# Patient Record
Sex: Female | Born: 1995 | Race: White | Hispanic: Yes | Marital: Single | Smoking: Never smoker
Health system: Southern US, Community
[De-identification: ages and names within clinical notes are randomized; demographics above are authoritative.]

## PROBLEM LIST (undated history)

## (undated) VITALS — BP 78/45 | HR 99 | Temp 97.8°F | Resp 16 | Ht 64.0 in | Wt 108.9 lb

## (undated) DIAGNOSIS — F909 Attention-deficit hyperactivity disorder, unspecified type: Secondary | ICD-10-CM

## (undated) DIAGNOSIS — F329 Major depressive disorder, single episode, unspecified: Secondary | ICD-10-CM

## (undated) DIAGNOSIS — J4520 Mild intermittent asthma, uncomplicated: Secondary | ICD-10-CM

## (undated) DIAGNOSIS — R1310 Dysphagia, unspecified: Secondary | ICD-10-CM

## (undated) DIAGNOSIS — F410 Panic disorder [episodic paroxysmal anxiety] without agoraphobia: Secondary | ICD-10-CM

## (undated) DIAGNOSIS — F419 Anxiety disorder, unspecified: Secondary | ICD-10-CM

## (undated) DIAGNOSIS — J309 Allergic rhinitis, unspecified: Secondary | ICD-10-CM

## (undated) DIAGNOSIS — K2930 Chronic superficial gastritis without bleeding: Secondary | ICD-10-CM

## (undated) DIAGNOSIS — F99 Mental disorder, not otherwise specified: Secondary | ICD-10-CM

## (undated) DIAGNOSIS — F32A Depression, unspecified: Secondary | ICD-10-CM

## (undated) DIAGNOSIS — E559 Vitamin D deficiency, unspecified: Secondary | ICD-10-CM

## (undated) DIAGNOSIS — S82899A Other fracture of unspecified lower leg, initial encounter for closed fracture: Secondary | ICD-10-CM

## (undated) HISTORY — DX: Attention-deficit hyperactivity disorder, unspecified type: F90.9

## (undated) HISTORY — PX: TONSILLECTOMY: SUR1361

## (undated) HISTORY — PX: WISDOM TOOTH EXTRACTION: SHX21

## (undated) HISTORY — DX: Mild intermittent asthma, uncomplicated: J45.20

## (undated) HISTORY — DX: Vitamin D deficiency, unspecified: E55.9

## (undated) HISTORY — DX: Other fracture of unspecified lower leg, initial encounter for closed fracture: S82.899A

## (undated) HISTORY — DX: Panic disorder (episodic paroxysmal anxiety): F41.0

## (undated) HISTORY — DX: Dysphagia, unspecified: R13.10

## (undated) HISTORY — DX: Major depressive disorder, single episode, unspecified: F32.9

## (undated) HISTORY — DX: Allergic rhinitis, unspecified: J30.9

## (undated) HISTORY — DX: Chronic superficial gastritis without bleeding: K29.30

---
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
## 2017-01-02 DIAGNOSIS — F419 Anxiety disorder, unspecified: Secondary | ICD-10-CM | POA: Diagnosis not present

## 2017-01-02 DIAGNOSIS — Z713 Dietary counseling and surveillance: Secondary | ICD-10-CM | POA: Diagnosis not present

## 2017-01-02 DIAGNOSIS — Z7182 Exercise counseling: Secondary | ICD-10-CM | POA: Diagnosis not present

## 2017-01-02 DIAGNOSIS — Z0000 Encounter for general adult medical examination without abnormal findings: Secondary | ICD-10-CM | POA: Diagnosis not present

## 2017-01-07 DIAGNOSIS — F4325 Adjustment disorder with mixed disturbance of emotions and conduct: Secondary | ICD-10-CM | POA: Diagnosis not present

## 2017-01-19 ENCOUNTER — Other Ambulatory Visit (HOSPITAL_COMMUNITY): Payer: Self-pay | Admitting: Psychiatry

## 2017-01-19 ENCOUNTER — Encounter (HOSPITAL_COMMUNITY): Payer: Self-pay | Admitting: Psychiatry

## 2017-01-19 DIAGNOSIS — F988 Other specified behavioral and emotional disorders with onset usually occurring in childhood and adolescence: Secondary | ICD-10-CM

## 2017-01-19 MED ORDER — DEXMETHYLPHENIDATE HCL ER 20 MG PO CP24: 20.0000 mg | ORAL_CAPSULE | Freq: Every day | ORAL | 0 refills | Status: DC

## 2017-01-21 ENCOUNTER — Telehealth (HOSPITAL_COMMUNITY): Payer: Self-pay | Admitting: Psychiatry

## 2017-01-21 NOTE — Telephone Encounter (Signed)
Durenda HurtWendee, mother picked up prescription on 1/6/108/8/18 lic [id number] dlh

## 2017-01-22 DIAGNOSIS — F4325 Adjustment disorder with mixed disturbance of emotions and conduct: Secondary | ICD-10-CM | POA: Diagnosis not present

## 2017-02-04 DIAGNOSIS — R002 Palpitations: Secondary | ICD-10-CM | POA: Diagnosis not present

## 2017-02-04 DIAGNOSIS — R9431 Abnormal electrocardiogram [ECG] [EKG]: Secondary | ICD-10-CM | POA: Diagnosis not present

## 2017-02-10 ENCOUNTER — Ambulatory Visit (INDEPENDENT_AMBULATORY_CARE_PROVIDER_SITE_OTHER): Payer: BLUE CROSS/BLUE SHIELD | Admitting: Psychiatry

## 2017-02-10 ENCOUNTER — Encounter (HOSPITAL_COMMUNITY): Payer: Self-pay | Admitting: Psychiatry

## 2017-02-10 DIAGNOSIS — F988 Other specified behavioral and emotional disorders with onset usually occurring in childhood and adolescence: Secondary | ICD-10-CM

## 2017-02-10 DIAGNOSIS — F3342 Major depressive disorder, recurrent, in full remission: Secondary | ICD-10-CM

## 2017-02-10 DIAGNOSIS — F819 Developmental disorder of scholastic skills, unspecified: Secondary | ICD-10-CM | POA: Diagnosis not present

## 2017-02-10 MED ORDER — DEXMETHYLPHENIDATE HCL ER 20 MG PO CP24: 20.0000 mg | ORAL_CAPSULE | Freq: Every day | ORAL | 0 refills | Status: DC

## 2017-02-10 MED ORDER — VENLAFAXINE HCL ER 150 MG PO CP24: ORAL_CAPSULE | ORAL | 0 refills | Status: DC

## 2017-02-10 NOTE — Progress Notes (Signed)
BH MD/PA/NP OP Progress Note  02/10/2017 10:48 AM Felicia [name]  MRN:  [id number]  Chief Complaint: med check  HPI: Felicia [name] continues to reflect on her future goals in terms of school and career. She is tolerating the Focalin without any significant issues. She has struggled with chronic periodic palpitations over the past few years, and is currently with Holter monitor, consultation with pediatric cardiology. This is not exacerbated or new in the setting of stimulant use. 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 We'll continue Focalin XR 20 mg daily, and Effexor and follow-up in 3 months.  Visit Diagnosis:    ICD-10-CM   1. MDD (major depressive disorder), recurrent, in full remission (HCC) F33.42 venlafaxine XR (EFFEXOR-XR) 150 MG 24 hr capsule  2. ADD (attention deficit disorder) without hyperactivity F98.8 dexmethylphenidate (FOCALIN XR) 20 MG 24 hr capsule    Past Psychiatric History: See intake H&P for full details. Reviewed, with no updates at this time.   Past Medical History:  Past Medical History:  Diagnosis Date  . Anxiety   . Broken ankle    at over 3 y/o of age  . Depression   . Mental disorder     Past Surgical History:  Procedure Laterality Date  . TONSILLECTOMY  age 22 or 56  . WISDOM TOOTH EXTRACTION  age 76    Family Psychiatric History: See intake H&P for full details. Reviewed, with no updates at this time.   Family History:  Family History  Problem Relation Age of Onset  . Adopted: Yes    Social History:  Social History   Social History  . Marital status: Single    Spouse name: N/A  . Number of children: N/A  . Years of education: N/A   Occupational History  . Student Unemployed    8th Grade at [employer]   Social History Main Topics  . Smoking status: Never Smoker  . Smokeless  tobacco: Never Used  . Alcohol use No  . Drug use: No  . Sexual activity: No   Other Topics Concern  . None   Social History Narrative  . None    Allergies:  Allergies  Allergen Reactions  . Benadryl [Diphenhydramine Hcl]     Metabolic Disorder Labs: Lab Results  Component Value Date   HGBA1C 5.5 03/12/2012   MPG 111 03/12/2012   Lab Results  Component Value Date   PROLACTIN 31.0 03/12/2012   Lab Results  Component Value Date   CHOL 169 03/12/2012   TRIG 81 03/12/2012   HDL 62 03/12/2012   CHOLHDL 2.7 03/12/2012   VLDL 16 03/12/2012   LDLCALC 91 03/12/2012   Lab Results  Component Value Date   TSH 2.542 03/10/2012    Therapeutic Level Labs: No results found for: LITHIUM No results found for: VALPROATE No components found for:  CBMZ  Current Medications: Current Outpatient Prescriptions  Medication Sig Dispense Refill  . dexmethylphenidate (FOCALIN XR) 20 MG 24 hr capsule Take 1 capsule (20 mg total) by mouth daily. 30 capsule 0  . Melatonin 1 MG TABS Take 2 mg by mouth at bedtime.    . naproxen sodium (ALEVE) 220 MG tablet Take 220 mg by mouth daily as needed.    Marland Kitchen OVER THE COUNTER MEDICATION Take 1 tablet by mouth daily. Allergy medication    .  venlafaxine XR (EFFEXOR-XR) 150 MG 24 hr capsule TAKE 1 CAP BY MOUTH DAILY WITH BREAKFAST 90 capsule 0  . [START ON 03/12/2017] dexmethylphenidate (FOCALIN XR) 20 MG 24 hr capsule Take 1 capsule (20 mg total) by mouth daily. 30 capsule 0  . [START ON 04/11/2017] dexmethylphenidate (FOCALIN XR) 20 MG 24 hr capsule Take 1 capsule (20 mg total) by mouth daily. 30 capsule 0   No current facility-administered medications for this visit.      Musculoskeletal: Strength & Muscle Tone: within normal limits Gait & Station: normal Patient leans: N/A  Psychiatric Specialty Exam: ROS  Blood pressure 118/68, pulse 80, height 5\' 3"  (1.6 m), weight 136 lb 6.4 oz (61.9 kg).Body mass index is 24.16 kg/m.  General  Appearance: Casual and Well Groomed  Eye Contact:  Fair  Speech:  Normal Rate  Volume:  Normal  Mood:  Euthymic  Affect:  Appropriate and Congruent  Thought Process:  Goal Directed  Orientation:  Full (Time, Place, and Person)  Thought Content: Logical   Suicidal Thoughts:  No  Homicidal Thoughts:  No  Memory:  Immediate;   Good  Judgement:  Fair  Insight:  Fair  Psychomotor Activity:  Normal  Concentration:  Concentration: Fair  Recall:  Fair  Fund of Knowledge: Good  Language: Fair  Akathisia:  Negative  Handed:  Right  AIMS (if indicated): not done  Assets:  Communication Skills Desire for Improvement Financial Resources/Insurance Housing Transportation  ADL's:  Intact  Cognition: WNL  Sleep:  Good   Screenings: PHQ2-9     Office Visit from 03/10/2013 in [facility]  PHQ-2 Total Score  0       Assessment and Plan: Felicia [name] is a 21 year old female with ADHD, MDD, and learning disorder who presents today for medication management follow-up. She is stable on the current regimen and will follow up in 3 months. No acute safety issues at this time.  1. MDD (major depressive disorder), recurrent, in full remission (HCC)   2. ADD (attention deficit disorder) without hyperactivity    Continue Focalin XR 20 mg daily Continue Effexor XR 150 mg daily Return to clinic in 3 months  Burnard Leigh, MD 02/10/2017, 10:48 AM

## 2017-02-14 DIAGNOSIS — R000 Tachycardia, unspecified: Secondary | ICD-10-CM | POA: Diagnosis not present

## 2017-03-02 DIAGNOSIS — F4325 Adjustment disorder with mixed disturbance of emotions and conduct: Secondary | ICD-10-CM | POA: Diagnosis not present

## 2017-04-12 DIAGNOSIS — J069 Acute upper respiratory infection, unspecified: Secondary | ICD-10-CM | POA: Diagnosis not present

## 2017-04-12 DIAGNOSIS — J029 Acute pharyngitis, unspecified: Secondary | ICD-10-CM | POA: Diagnosis not present

## 2017-04-20 DIAGNOSIS — F4325 Adjustment disorder with mixed disturbance of emotions and conduct: Secondary | ICD-10-CM | POA: Diagnosis not present

## 2017-05-03 DIAGNOSIS — J069 Acute upper respiratory infection, unspecified: Secondary | ICD-10-CM | POA: Diagnosis not present

## 2017-05-13 ENCOUNTER — Ambulatory Visit (HOSPITAL_COMMUNITY): Payer: BLUE CROSS/BLUE SHIELD | Admitting: Psychiatry

## 2017-05-14 ENCOUNTER — Ambulatory Visit (HOSPITAL_COMMUNITY): Payer: BLUE CROSS/BLUE SHIELD | Admitting: Psychiatry

## 2017-05-14 ENCOUNTER — Encounter (HOSPITAL_COMMUNITY): Payer: Self-pay | Admitting: Psychiatry

## 2017-05-14 DIAGNOSIS — Z79899 Other long term (current) drug therapy: Secondary | ICD-10-CM | POA: Diagnosis not present

## 2017-05-14 DIAGNOSIS — F3342 Major depressive disorder, recurrent, in full remission: Secondary | ICD-10-CM

## 2017-05-14 DIAGNOSIS — G479 Sleep disorder, unspecified: Secondary | ICD-10-CM | POA: Diagnosis not present

## 2017-05-14 DIAGNOSIS — F900 Attention-deficit hyperactivity disorder, predominantly inattentive type: Secondary | ICD-10-CM | POA: Diagnosis not present

## 2017-05-14 MED ORDER — DEXMETHYLPHENIDATE HCL ER 20 MG PO CP24: 20.0000 mg | ORAL_CAPSULE | Freq: Every day | ORAL | 0 refills | Status: DC

## 2017-05-14 MED ORDER — VENLAFAXINE HCL ER 150 MG PO CP24: ORAL_CAPSULE | ORAL | 1 refills | Status: DC

## 2017-05-14 MED ORDER — MELATONIN 1 MG PO TABS: ORAL_TABLET | ORAL | Status: AC

## 2017-05-14 NOTE — Progress Notes (Signed)
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  Reports effexor is tolerated well and focalin XR 20 mg seems to be adequate. 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 She reports that melatonin does help, encouraged her to increase to 5 mg - 10 mg nightly with sundown. She is agreeable and we reviewed potential of using rozerem or hetlioz if needed.  No acute safety issues. Continues to date her boyfriend of 4 years.  No major life stressors currently.  Visit Diagnosis:    ICD-10-CM   1. Attention deficit hyperactivity disorder (ADHD), predominantly inattentive type F90.0 dexmethylphenidate (FOCALIN XR) 20 MG 24 hr capsule    dexmethylphenidate (FOCALIN XR) 20 MG 24 hr capsule    dexmethylphenidate (FOCALIN XR) 20 MG 24 hr capsule  2. MDD (major depressive disorder), recurrent, in full remission (HCC) F33.42 venlafaxine XR (EFFEXOR-XR) 150 MG 24 hr capsule    Melatonin 1 MG TABS    Past Psychiatric History: See intake H&P for full details. Reviewed, with no updates at this time.   Past Medical History:  Past Medical History:  Diagnosis Date  . Anxiety   . Broken ankle    at over 3 y/o of age  . Depression   . Mental disorder     Past Surgical History:  Procedure Laterality Date  . TONSILLECTOMY  age 89 or 89  . WISDOM TOOTH EXTRACTION  age 21    Family Psychiatric History: See intake H&P for full details. Reviewed, with no updates at this time.   Family History:  Family History  Adopted: Yes    Social History:  Social History   Socioeconomic History  . Marital status: Single    Spouse name: Not on file  . Number of children: Not on file  . Years of education: Not on file  . Highest education level: Not on file  Social Needs  .  Financial resource strain: Not on file  . Food insecurity - worry: Not on file  . Food insecurity - inability: Not on file  . Transportation needs - medical: Not on file  . Transportation needs - non-medical: Not on file  Occupational History  . Occupation: Dentisttudent    Employer: UNEMPLOYED    Comment: 8th Grade at [employer]  Tobacco Use  . Smoking status: Never Smoker  . Smokeless tobacco: Never Used  Substance and Sexual Activity  . Alcohol use: No  . Drug use: No  . Sexual activity: No  Other Topics Concern  . Not on file  Social History Narrative  . Not on file    Allergies:  Allergies  Allergen Reactions  . Benadryl [Diphenhydramine Hcl]     Metabolic Disorder Labs: Lab Results  Component Value Date   HGBA1C 5.5 03/12/2012   MPG 111 03/12/2012   Lab Results  Component Value Date   PROLACTIN 31.0 03/12/2012   Lab Results  Component Value Date   CHOL 169 03/12/2012   TRIG 81 03/12/2012   HDL 62 03/12/2012   CHOLHDL 2.7 03/12/2012   VLDL 16 03/12/2012   LDLCALC 91 03/12/2012   Lab Results  Component Value Date   TSH 2.542 03/10/2012    Therapeutic Level Labs: No results found for: LITHIUM No results found  for: VALPROATE No components found for:  CBMZ  Current Medications: Current Outpatient Medications  Medication Sig Dispense Refill  . [START ON 07/13/2017] dexmethylphenidate (FOCALIN XR) 20 MG 24 hr capsule Take 1 capsule (20 mg total) by mouth daily. 30 capsule 0  . [START ON 06/13/2017] dexmethylphenidate (FOCALIN XR) 20 MG 24 hr capsule Take 1 capsule (20 mg total) by mouth daily. 30 capsule 0  . dexmethylphenidate (FOCALIN XR) 20 MG 24 hr capsule Take 1 capsule (20 mg total) by mouth daily. 30 capsule 0  . Melatonin 1 MG TABS Take 5-10 mg at SUNDOWN    . naproxen sodium (ALEVE) 220 MG tablet Take 220 mg by mouth daily as needed.    Marland Kitchen. OVER THE COUNTER MEDICATION Take 1 tablet by mouth daily. Allergy medication    . venlafaxine XR  (EFFEXOR-XR) 150 MG 24 hr capsule TAKE 1 CAP BY MOUTH DAILY WITH BREAKFAST 90 capsule 1   No current facility-administered medications for this visit.      Musculoskeletal: Strength & Muscle Tone: within normal limits Gait & Station: normal Patient leans: N/A  Psychiatric Specialty Exam: ROS  There were no vitals taken for this visit.There is no height or weight on file to calculate BMI.  General Appearance: Casual and Fairly Groomed  Eye Contact:  Good  Speech:  Clear and Coherent  Volume:  Normal  Mood:  Euthymic  Affect:  Congruent  Thought Process:  Coherent and Descriptions of Associations: Intact  Orientation:  Full (Time, Place, and Person)  Thought Content: Logical   Suicidal Thoughts:  No  Homicidal Thoughts:  No  Memory:  Immediate;   Good  Judgement:  Good  Insight:  Fair  Psychomotor Activity:  Normal  Concentration:  Attention Span: Good  Recall:  Good  Fund of Knowledge: Good  Language: Good  Akathisia:  Negative  Handed:  Right  AIMS (if indicated): not done  Assets:  Communication Skills Desire for Improvement Financial Resources/Insurance Housing Intimacy Social Support Talents/Skills Transportation Vocational/Educational  ADL's:  Intact  Cognition: WNL  Sleep:  Fair   Screenings: PHQ2-9     Office Visit from 03/10/2013 in [facility]  PHQ-2 Total Score  0       Assessment and Plan:  Felicia [name] reports overall mood and ADHD stability on the regimen below.  Working consistent at preschool, with improving self esteem, and socialization.  No significant safety issues. Has some symptoms concerning for non-24 sleep disorder, partially responsive to melatonin. Will proceed as below and RTC in 3-4 months.  1. Attention deficit hyperactivity disorder (ADHD), predominantly inattentive type   2. MDD (major depressive disorder), recurrent, in full remission (HCC)     Status of current problems:  stable  Labs Ordered: No orders of the defined types were placed in this encounter.   Labs Reviewed: n/a  Collateral Obtained/Records Reviewed: n/a  Plan:  Continue effexor XR 150 mg daily Continue focalin 20 mg XR daily RTC 3-4 months Consider rozerem if needed for sleep Melatonin 5-10 mg at sundown nightly  I spent 20 minutes with the patient in direct face-to-face clinical care.  Greater than 50% of this time was spent in counseling and coordination of care with the patient.    Burnard LeighAlexander Arya Shantrice Rodenberg, MD 05/14/2017, 4:39 PM

## 2017-05-23 DIAGNOSIS — Z23 Encounter for immunization: Secondary | ICD-10-CM | POA: Diagnosis not present

## 2017-06-05 DIAGNOSIS — F4325 Adjustment disorder with mixed disturbance of emotions and conduct: Secondary | ICD-10-CM | POA: Diagnosis not present

## 2017-06-23 DIAGNOSIS — H1013 Acute atopic conjunctivitis, bilateral: Secondary | ICD-10-CM | POA: Diagnosis not present

## 2017-08-02 DIAGNOSIS — M25571 Pain in right ankle and joints of right foot: Secondary | ICD-10-CM | POA: Diagnosis not present

## 2017-08-06 ENCOUNTER — Encounter (HOSPITAL_COMMUNITY): Payer: Self-pay | Admitting: Psychiatry

## 2017-08-06 ENCOUNTER — Ambulatory Visit (HOSPITAL_COMMUNITY): Payer: BLUE CROSS/BLUE SHIELD | Admitting: Psychiatry

## 2017-08-06 DIAGNOSIS — F900 Attention-deficit hyperactivity disorder, predominantly inattentive type: Secondary | ICD-10-CM | POA: Diagnosis not present

## 2017-08-06 DIAGNOSIS — Z79899 Other long term (current) drug therapy: Secondary | ICD-10-CM

## 2017-08-06 DIAGNOSIS — F3342 Major depressive disorder, recurrent, in full remission: Secondary | ICD-10-CM | POA: Diagnosis not present

## 2017-08-06 MED ORDER — DEXMETHYLPHENIDATE HCL ER 20 MG PO CP24: 20.0000 mg | ORAL_CAPSULE | Freq: Every day | ORAL | 0 refills | Status: DC

## 2017-08-06 MED ORDER — VENLAFAXINE HCL ER 150 MG PO CP24: ORAL_CAPSULE | ORAL | 1 refills | Status: DC

## 2017-08-06 NOTE — Patient Instructions (Signed)
Call [facility] for weekly group therapy  Address: [address] [place], [address] Phone: [phone] Email: [email or web address] Business Hours: M-F 8a-8p, S-S by appointment

## 2017-08-06 NOTE — Progress Notes (Signed)
BH MD/PA/NP OP Progress Note  08/06/2017 3:56 PM Felicia [name]  MRN:  [id number]  Chief Complaint: doing well overall HPI: Felicia Slim reports that overall she is doing well.  No acute safety issues.  She is sleeping well and continues on her current medication management regimen.  Spent some time discussing some of her concerns about being sensitive to criticism, and she would like to learn better strategies for managing her frustration.  Individual therapy has been helpful at points, but I discussed the value of group therapy and provided referral to [facility] counseling.  She was apprehensive but agreeable to go in for an initial visit and to learn more.  No significant side effects from medications.  Overall she feels like she is doing better and her confidence is improving because she is working and more independent.  Visit Diagnosis:    ICD-10-CM   1. Attention deficit hyperactivity disorder (ADHD), predominantly inattentive type F90.0 dexmethylphenidate (FOCALIN XR) 20 MG 24 hr capsule    dexmethylphenidate (FOCALIN XR) 20 MG 24 hr capsule    dexmethylphenidate (FOCALIN XR) 20 MG 24 hr capsule  2. MDD (major depressive disorder), recurrent, in full remission (HCC) F33.42 venlafaxine XR (EFFEXOR-XR) 150 MG 24 hr capsule    Past Psychiatric History: See intake H&P for full details. Reviewed, with no updates at this time.   Past Medical History:  Past Medical History:  Diagnosis Date  . Anxiety   . Broken ankle    at over 3 y/o of age  . Depression   . Mental disorder     Past Surgical History:  Procedure Laterality Date  . TONSILLECTOMY  age 6 or 65  . WISDOM TOOTH EXTRACTION  age 72    Family Psychiatric History: See intake H&P for full details. Reviewed, with no updates at this time.   Family History:  Family History  Adopted: Yes    Social History:  Social History   Socioeconomic History  . Marital status: Single    Spouse name: None  . Number of children:  None  . Years of education: None  . Highest education level: None  Social Needs  . Financial resource strain: None  . Food insecurity - worry: None  . Food insecurity - inability: None  . Transportation needs - medical: None  . Transportation needs - non-medical: None  Occupational History  . Occupation: Dentist: UNEMPLOYED    Comment: 8th Grade at [employer]  Tobacco Use  . Smoking status: Never Smoker  . Smokeless tobacco: Never Used  Substance and Sexual Activity  . Alcohol use: No  . Drug use: No  . Sexual activity: No  Other Topics Concern  . None  Social History Narrative  . None    Allergies:  Allergies  Allergen Reactions  . Benadryl [Diphenhydramine Hcl]     Metabolic Disorder Labs: Lab Results  Component Value Date   HGBA1C 5.5 03/12/2012   MPG 111 03/12/2012   Lab Results  Component Value Date   PROLACTIN 31.0 03/12/2012   Lab Results  Component Value Date   CHOL 169 03/12/2012   TRIG 81 03/12/2012   HDL 62 03/12/2012   CHOLHDL 2.7 03/12/2012   VLDL 16 03/12/2012   LDLCALC 91 03/12/2012   Lab Results  Component Value Date   TSH 2.542 03/10/2012    Therapeutic Level Labs: No results found for: LITHIUM No results found for: VALPROATE No components found for:  CBMZ  Current  Medications: Current Outpatient Medications  Medication Sig Dispense Refill  . Melatonin 1 MG TABS Take 5-10 mg at SUNDOWN    . naproxen sodium (ALEVE) 220 MG tablet Take 220 mg by mouth daily as needed.    Marland Kitchen. OVER THE COUNTER MEDICATION Take 1 tablet by mouth daily. Allergy medication    . [START ON 10/05/2017] dexmethylphenidate (FOCALIN XR) 20 MG 24 hr capsule Take 1 capsule (20 mg total) by mouth daily. 30 capsule 0  . [START ON 09/05/2017] dexmethylphenidate (FOCALIN XR) 20 MG 24 hr capsule Take 1 capsule (20 mg total) by mouth daily. 30 capsule 0  . dexmethylphenidate (FOCALIN XR) 20 MG 24 hr capsule Take 1 capsule (20 mg total) by mouth daily.  30 capsule 0  . venlafaxine XR (EFFEXOR-XR) 150 MG 24 hr capsule TAKE 1 CAP BY MOUTH DAILY WITH BREAKFAST 90 capsule 1   No current facility-administered medications for this visit.      Musculoskeletal: Strength & Muscle Tone: within normal limits Gait & Station: normal Patient leans: N/A  Psychiatric Specialty Exam: ROS  Blood pressure 115/77, pulse (!) 105, height 5\' 3"  (1.6 m), weight 130 lb (59 kg).Body mass index is 23.03 kg/m.  General Appearance: Casual and Fairly Groomed  Eye Contact:  Good  Speech:  Clear and Coherent and Normal Rate  Volume:  Normal  Mood:  Euthymic  Affect:  Appropriate and Congruent  Thought Process:  Goal Directed and Descriptions of Associations: Intact  Orientation:  Full (Time, Place, and Person)  Thought Content: Logical   Suicidal Thoughts:  No  Homicidal Thoughts:  No  Memory:  Immediate;   Good  Judgement:  Good  Insight:  Good  Psychomotor Activity:  Normal  Concentration:  Attention Span: Good  Recall:  Good  Fund of Knowledge: Good  Language: Good  Akathisia:  Negative  Handed:  Right  AIMS (if indicated): not done  Assets:  Communication Skills Desire for Improvement Financial Resources/Insurance Housing Intimacy Leisure Time Physical Health Resilience Social Support Talents/Skills Transportation Vocational/Educational  ADL's:  Intact  Cognition: WNL  Sleep:  Good   Screenings: PHQ2-9     Office Visit from 03/10/2013 in [facility]  PHQ-2 Total Score  0       Assessment and Plan:  Felicia [name] presents as stable on the current medication regimen. No acute safety issues or substance abuse concerns at this time.  Will proceed as below and RTC in 3 months.  [facility], I am hopeful that she will take away skills to better advocate and express herself in conflict.  1. Attention deficit hyperactivity disorder (ADHD), predominantly  inattentive type   2. MDD (major depressive disorder), recurrent, in full remission (HCC)     Status of current problems: stable  Labs Ordered: No orders of the defined types were placed in this encounter.   Labs Reviewed: n/a  Collateral Obtained/Records Reviewed: n/a  Plan:  Effexor 150 mg XR daily Focalin 20 mg XR daily rtc 3-4 months  I spent 20 minutes with the patient in direct face-to-face clinical care.  Greater than 50% of this time was spent in counseling and coordination of care with the patient.    Burnard LeighAlexander Arya Eksir, MD 08/06/2017, 3:56 PM

## 2017-08-12 DIAGNOSIS — F4325 Adjustment disorder with mixed disturbance of emotions and conduct: Secondary | ICD-10-CM | POA: Diagnosis not present

## 2017-08-26 DIAGNOSIS — F5104 Psychophysiologic insomnia: Secondary | ICD-10-CM | POA: Diagnosis not present

## 2017-08-26 DIAGNOSIS — F909 Attention-deficit hyperactivity disorder, unspecified type: Secondary | ICD-10-CM | POA: Diagnosis not present

## 2017-08-26 DIAGNOSIS — F329 Major depressive disorder, single episode, unspecified: Secondary | ICD-10-CM | POA: Diagnosis not present

## 2017-09-21 DIAGNOSIS — F4325 Adjustment disorder with mixed disturbance of emotions and conduct: Secondary | ICD-10-CM | POA: Diagnosis not present

## 2017-09-25 DIAGNOSIS — H1032 Unspecified acute conjunctivitis, left eye: Secondary | ICD-10-CM | POA: Diagnosis not present

## 2017-10-07 DIAGNOSIS — F329 Major depressive disorder, single episode, unspecified: Secondary | ICD-10-CM | POA: Diagnosis not present

## 2017-10-07 DIAGNOSIS — D72828 Other elevated white blood cell count: Secondary | ICD-10-CM | POA: Diagnosis not present

## 2017-10-07 DIAGNOSIS — E559 Vitamin D deficiency, unspecified: Secondary | ICD-10-CM | POA: Diagnosis not present

## 2017-10-07 DIAGNOSIS — H1089 Other conjunctivitis: Secondary | ICD-10-CM | POA: Diagnosis not present

## 2017-10-08 DIAGNOSIS — F4325 Adjustment disorder with mixed disturbance of emotions and conduct: Secondary | ICD-10-CM | POA: Diagnosis not present

## 2017-10-21 DIAGNOSIS — F4325 Adjustment disorder with mixed disturbance of emotions and conduct: Secondary | ICD-10-CM | POA: Diagnosis not present

## 2017-11-03 ENCOUNTER — Encounter (HOSPITAL_COMMUNITY): Payer: Self-pay | Admitting: Psychiatry

## 2017-11-03 ENCOUNTER — Ambulatory Visit (HOSPITAL_COMMUNITY): Payer: BLUE CROSS/BLUE SHIELD | Admitting: Psychiatry

## 2017-11-03 DIAGNOSIS — Z560 Unemployment, unspecified: Secondary | ICD-10-CM

## 2017-11-03 DIAGNOSIS — F3342 Major depressive disorder, recurrent, in full remission: Secondary | ICD-10-CM | POA: Diagnosis not present

## 2017-11-03 DIAGNOSIS — F900 Attention-deficit hyperactivity disorder, predominantly inattentive type: Secondary | ICD-10-CM | POA: Diagnosis not present

## 2017-11-03 DIAGNOSIS — G4700 Insomnia, unspecified: Secondary | ICD-10-CM

## 2017-11-03 MED ORDER — VENLAFAXINE HCL ER 150 MG PO CP24: ORAL_CAPSULE | ORAL | 1 refills | Status: AC

## 2017-11-03 MED ORDER — DEXMETHYLPHENIDATE HCL ER 20 MG PO CP24: 20.0000 mg | ORAL_CAPSULE | Freq: Every day | ORAL | 0 refills | Status: DC

## 2017-11-03 MED ORDER — TRAZODONE HCL 50 MG PO TABS: 50.0000 mg | ORAL_TABLET | Freq: Every day | ORAL | 1 refills | Status: DC

## 2017-11-03 NOTE — Progress Notes (Signed)
BH MD/PA/NP OP Progress Note  11/03/2017 4:07 PM Felicia [name]  MRN:  [id number]  Chief Complaint: adhd, med management follow-up  HPI: Felicia [name] has been doing well overall in terms of mood, ADHD.  She is having trouble sleeping, and the melatonin has worn away a little bit.  We agreed to try a small dose of trazodone nightly, and reviewed some of the common side effects.  She reports that she was laid off from her job due to financial constraints of the business, and she is sad about this but also happy that she was able to have many good months there.  She is in the process of working on finding a new job and is going to visit [employer] for some help in this.  Spent time with the patient alone and inquired about sexual activity, substance use, safety issues.  She remains abstinent, denies any and all substance abuse and has no acute safety concerns.    We agreed to continue Effexor and Focalin XR at the current doses, along with start a trial of trazodone.  We will follow-up in 3-4 months or sooner if needed.  Visit Diagnosis:    ICD-10-CM   1. Attention deficit hyperactivity disorder (ADHD), predominantly inattentive type F90.0 dexmethylphenidate (FOCALIN XR) 20 MG 24 hr capsule    dexmethylphenidate (FOCALIN XR) 20 MG 24 hr capsule    dexmethylphenidate (FOCALIN XR) 20 MG 24 hr capsule  2. MDD (major depressive disorder), recurrent, in full remission (HCC) F33.42 venlafaxine XR (EFFEXOR-XR) 150 MG 24 hr capsule    Past Psychiatric History: See intake H&P for full details. Reviewed, with no updates at this time.  Past Medical History:  Past Medical History:  Diagnosis Date  . Anxiety   . Broken ankle    at over 3 y/o of age  . Depression   . Mental disorder     Past Surgical History:  Procedure Laterality Date  . TONSILLECTOMY  age 63 or 61  . WISDOM TOOTH EXTRACTION  age 78    Family Psychiatric History: See intake H&P for full details. Reviewed, with no updates at this  time.   Family History:  Family History  Adopted: Yes    Social History:  Social History   Socioeconomic History  . Marital status: Single    Spouse name: Not on file  . Number of children: Not on file  . Years of education: Not on file  . Highest education level: Not on file  Occupational History  . Occupation: Dentist: UNEMPLOYED    Comment: 8th Grade at [employer]  Social Needs  . Financial resource strain: Not on file  . Food insecurity:    Worry: Not on file    Inability: Not on file  . Transportation needs:    Medical: Not on file    Non-medical: Not on file  Tobacco Use  . Smoking status: Never Smoker  . Smokeless tobacco: Never Used  Substance and Sexual Activity  . Alcohol use: No  . Drug use: No  . Sexual activity: Never  Lifestyle  . Physical activity:    Days per week: Not on file    Minutes per session: Not on file  . Stress: Not on file  Relationships  . Social connections:    Talks on phone: Not on file    Gets together: Not on file    Attends religious service: Not on file    Active member of  club or organization: Not on file    Attends meetings of clubs or organizations: Not on file    Relationship status: Not on file  Other Topics Concern  . Not on file  Social History Narrative  . Not on file    Allergies:  Allergies  Allergen Reactions  . Benadryl [Diphenhydramine Hcl]     Metabolic Disorder Labs: Lab Results  Component Value Date   HGBA1C 5.5 03/12/2012   MPG 111 03/12/2012   Lab Results  Component Value Date   PROLACTIN 31.0 03/12/2012   Lab Results  Component Value Date   CHOL 169 03/12/2012   TRIG 81 03/12/2012   HDL 62 03/12/2012   CHOLHDL 2.7 03/12/2012   VLDL 16 03/12/2012   LDLCALC 91 03/12/2012   Lab Results  Component Value Date   TSH 2.542 03/10/2012    Therapeutic Level Labs: No results found for: LITHIUM No results found for: VALPROATE No components found for:   CBMZ  Current Medications: Current Outpatient Medications  Medication Sig Dispense Refill  . [START ON 12/29/2017] dexmethylphenidate (FOCALIN XR) 20 MG 24 hr capsule Take 1 capsule (20 mg total) by mouth daily. 30 capsule 0  . [START ON 12/01/2017] dexmethylphenidate (FOCALIN XR) 20 MG 24 hr capsule Take 1 capsule (20 mg total) by mouth daily. 30 capsule 0  . dexmethylphenidate (FOCALIN XR) 20 MG 24 hr capsule Take 1 capsule (20 mg total) by mouth daily. 30 capsule 0  . Melatonin 1 MG TABS Take 5-10 mg at SUNDOWN    . naproxen sodium (ALEVE) 220 MG tablet Take 220 mg by mouth daily as needed.    Marland Kitchen OVER THE COUNTER MEDICATION Take 1 tablet by mouth daily. Allergy medication    . venlafaxine XR (EFFEXOR-XR) 150 MG 24 hr capsule TAKE 1 CAP BY MOUTH DAILY WITH BREAKFAST 90 capsule 1  . [START ON 01/26/2018] dexmethylphenidate (FOCALIN XR) 20 MG 24 hr capsule Take 1 capsule (20 mg total) by mouth daily. 30 capsule 0  . traZODone (DESYREL) 50 MG tablet Take 1 tablet (50 mg total) by mouth at bedtime. 90 tablet 1   No current facility-administered medications for this visit.      Musculoskeletal: Strength & Muscle Tone: within normal limits Gait & Station: normal Patient leans: N/A  Psychiatric Specialty Exam: ROS  Blood pressure 112/72, pulse (!) 109, height  (1.6 m), weight 149 lb (67.6 kg), SpO2 97 %.Body mass index is 26.39 kg/m.  General Appearance: Casual and Well Groomed  Eye Contact:  Good  Speech:  Clear and Coherent and Normal Rate  Volume:  Normal  Mood:  Euthymic  Affect:  Appropriate and Congruent  Thought Process:  Goal Directed and Descriptions of Associations: Intact  Orientation:  Full (Time, Place, and Person)  Thought Content: Logical   Suicidal Thoughts:  No  Homicidal Thoughts:  No  Memory:  Immediate;   Fair  Judgement:  Good  Insight:  Good  Psychomotor Activity:  Normal  Concentration:  Concentration: Good  Recall:  Good  Fund of Knowledge: Good   Language: Good  Akathisia:  Negative  Handed:  Right  AIMS (if indicated): not done  Assets:  Communication Skills Desire for Improvement Financial Resources/Insurance Housing  ADL's:  Intact  Cognition: WNL  Sleep:  Good   Screenings: PHQ2-9     Office Visit from 03/10/2013 in [facility]  PHQ-2 Total Score  0      Assessment and Plan: Trang R  Hickok presents with overall stable mood and ADHD symptoms.  Some insomnia symptoms we will address with low-dose of trazodone, and follow-up in 3 months.  She continues to work on establishing a job for herself, and is in the process of finding a new position.  No unsafe thoughts, substance abuse, and she is not sexually active at this time.  1. Attention deficit hyperactivity disorder (ADHD), predominantly inattentive type   2. MDD (major depressive disorder), recurrent, in full remission (HCC)     Status of current problems: stable  Labs Ordered: No orders of the defined types were placed in this encounter.  Labs Reviewed: n/a  Collateral Obtained/Records Reviewed: mom present for part of interaction, nccsd reviewed   Plan:  Continue Effexor and Focalin XR as prescribed rtc 3-4 months   Burnard Leigh, MD 11/03/2017, 4:07 PM

## 2017-11-05 DIAGNOSIS — F4325 Adjustment disorder with mixed disturbance of emotions and conduct: Secondary | ICD-10-CM | POA: Diagnosis not present

## 2017-12-08 DIAGNOSIS — F4325 Adjustment disorder with mixed disturbance of emotions and conduct: Secondary | ICD-10-CM | POA: Diagnosis not present

## 2017-12-28 DIAGNOSIS — F4325 Adjustment disorder with mixed disturbance of emotions and conduct: Secondary | ICD-10-CM | POA: Diagnosis not present

## 2018-02-16 DIAGNOSIS — F332 Major depressive disorder, recurrent severe without psychotic features: Secondary | ICD-10-CM | POA: Diagnosis not present

## 2018-03-31 DIAGNOSIS — F332 Major depressive disorder, recurrent severe without psychotic features: Secondary | ICD-10-CM | POA: Diagnosis not present

## 2018-04-21 DIAGNOSIS — F332 Major depressive disorder, recurrent severe without psychotic features: Secondary | ICD-10-CM | POA: Diagnosis not present

## 2018-06-14 DIAGNOSIS — F332 Major depressive disorder, recurrent severe without psychotic features: Secondary | ICD-10-CM | POA: Diagnosis not present

## 2018-08-25 DIAGNOSIS — Z0000 Encounter for general adult medical examination without abnormal findings: Secondary | ICD-10-CM | POA: Diagnosis not present

## 2018-08-25 DIAGNOSIS — Z13220 Encounter for screening for lipoid disorders: Secondary | ICD-10-CM | POA: Diagnosis not present

## 2018-08-25 DIAGNOSIS — Z23 Encounter for immunization: Secondary | ICD-10-CM | POA: Diagnosis not present

## 2018-08-25 DIAGNOSIS — F329 Major depressive disorder, single episode, unspecified: Secondary | ICD-10-CM | POA: Diagnosis not present

## 2018-08-25 DIAGNOSIS — F909 Attention-deficit hyperactivity disorder, unspecified type: Secondary | ICD-10-CM | POA: Diagnosis not present

## 2018-09-13 DIAGNOSIS — F332 Major depressive disorder, recurrent severe without psychotic features: Secondary | ICD-10-CM | POA: Diagnosis not present

## 2018-12-21 DIAGNOSIS — F332 Major depressive disorder, recurrent severe without psychotic features: Secondary | ICD-10-CM | POA: Diagnosis not present

## 2019-02-10 DIAGNOSIS — F332 Major depressive disorder, recurrent severe without psychotic features: Secondary | ICD-10-CM | POA: Diagnosis not present

## 2019-02-25 DIAGNOSIS — F419 Anxiety disorder, unspecified: Secondary | ICD-10-CM | POA: Diagnosis not present

## 2019-02-25 DIAGNOSIS — E559 Vitamin D deficiency, unspecified: Secondary | ICD-10-CM | POA: Diagnosis not present

## 2019-02-25 DIAGNOSIS — F909 Attention-deficit hyperactivity disorder, unspecified type: Secondary | ICD-10-CM | POA: Diagnosis not present

## 2019-04-14 DIAGNOSIS — F332 Major depressive disorder, recurrent severe without psychotic features: Secondary | ICD-10-CM | POA: Diagnosis not present

## 2019-05-31 DIAGNOSIS — F332 Major depressive disorder, recurrent severe without psychotic features: Secondary | ICD-10-CM | POA: Diagnosis not present

## 2019-06-28 ENCOUNTER — Ambulatory Visit (INDEPENDENT_AMBULATORY_CARE_PROVIDER_SITE_OTHER): Payer: BC Managed Care – PPO | Admitting: Clinical

## 2019-06-28 DIAGNOSIS — F89 Unspecified disorder of psychological development: Secondary | ICD-10-CM | POA: Diagnosis not present

## 2019-08-21 DIAGNOSIS — F332 Major depressive disorder, recurrent severe without psychotic features: Secondary | ICD-10-CM | POA: Diagnosis not present

## 2019-08-26 DIAGNOSIS — Z0000 Encounter for general adult medical examination without abnormal findings: Secondary | ICD-10-CM | POA: Diagnosis not present

## 2019-08-26 DIAGNOSIS — Z13220 Encounter for screening for lipoid disorders: Secondary | ICD-10-CM | POA: Diagnosis not present

## 2019-08-26 DIAGNOSIS — E559 Vitamin D deficiency, unspecified: Secondary | ICD-10-CM | POA: Diagnosis not present

## 2019-09-20 ENCOUNTER — Ambulatory Visit: Payer: BC Managed Care – PPO | Admitting: Clinical

## 2019-09-21 ENCOUNTER — Ambulatory Visit: Payer: BC Managed Care – PPO | Admitting: Clinical

## 2019-09-28 ENCOUNTER — Other Ambulatory Visit (HOSPITAL_COMMUNITY)
Admission: RE | Admit: 2019-09-28 | Discharge: 2019-09-28 | Disposition: A | Discharge status: Home / Self Care | Payer: BC Managed Care – PPO | Source: Ambulatory Visit | Attending: Internal Medicine | Admitting: Internal Medicine

## 2019-09-28 ENCOUNTER — Other Ambulatory Visit: Payer: Self-pay | Admitting: Internal Medicine

## 2019-09-28 DIAGNOSIS — Z124 Encounter for screening for malignant neoplasm of cervix: Secondary | ICD-10-CM | POA: Insufficient documentation

## 2019-09-30 LAB — CYTOLOGY - PAP
Adequacy: ABSENT
Diagnosis: NEGATIVE

## 2019-11-13 DIAGNOSIS — F332 Major depressive disorder, recurrent severe without psychotic features: Secondary | ICD-10-CM | POA: Diagnosis not present

## 2019-11-23 ENCOUNTER — Ambulatory Visit: Payer: BC Managed Care – PPO | Admitting: Clinical

## 2019-11-23 ENCOUNTER — Other Ambulatory Visit: Payer: Self-pay

## 2019-12-13 ENCOUNTER — Ambulatory Visit: Payer: Self-pay | Admitting: Clinical

## 2019-12-20 ENCOUNTER — Ambulatory Visit: Payer: BC Managed Care – PPO | Admitting: Clinical

## 2019-12-23 DIAGNOSIS — H5203 Hypermetropia, bilateral: Secondary | ICD-10-CM | POA: Diagnosis not present

## 2019-12-29 ENCOUNTER — Ambulatory Visit (INDEPENDENT_AMBULATORY_CARE_PROVIDER_SITE_OTHER): Payer: BC Managed Care – PPO | Admitting: Clinical

## 2019-12-29 DIAGNOSIS — F89 Unspecified disorder of psychological development: Secondary | ICD-10-CM

## 2019-12-31 DIAGNOSIS — F332 Major depressive disorder, recurrent severe without psychotic features: Secondary | ICD-10-CM | POA: Diagnosis not present

## 2020-02-07 DIAGNOSIS — F332 Major depressive disorder, recurrent severe without psychotic features: Secondary | ICD-10-CM | POA: Diagnosis not present

## 2020-05-03 ENCOUNTER — Ambulatory Visit (INDEPENDENT_AMBULATORY_CARE_PROVIDER_SITE_OTHER): Payer: BC Managed Care – PPO | Admitting: Clinical

## 2020-05-03 DIAGNOSIS — F89 Unspecified disorder of psychological development: Secondary | ICD-10-CM

## 2020-05-18 ENCOUNTER — Ambulatory Visit (INDEPENDENT_AMBULATORY_CARE_PROVIDER_SITE_OTHER): Payer: BC Managed Care – PPO | Admitting: Clinical

## 2020-05-18 DIAGNOSIS — F89 Unspecified disorder of psychological development: Secondary | ICD-10-CM

## 2020-06-27 ENCOUNTER — Ambulatory Visit (INDEPENDENT_AMBULATORY_CARE_PROVIDER_SITE_OTHER): Payer: BC Managed Care – PPO | Admitting: Clinical

## 2020-06-27 DIAGNOSIS — F331 Major depressive disorder, recurrent, moderate: Secondary | ICD-10-CM

## 2020-06-27 DIAGNOSIS — F71 Moderate intellectual disabilities: Secondary | ICD-10-CM | POA: Diagnosis not present

## 2020-07-05 ENCOUNTER — Ambulatory Visit (INDEPENDENT_AMBULATORY_CARE_PROVIDER_SITE_OTHER): Payer: BC Managed Care – PPO | Admitting: Clinical

## 2020-07-05 DIAGNOSIS — F89 Unspecified disorder of psychological development: Secondary | ICD-10-CM | POA: Diagnosis not present

## 2020-07-16 ENCOUNTER — Ambulatory Visit: Payer: BC Managed Care – PPO | Admitting: Clinical

## 2020-07-18 ENCOUNTER — Ambulatory Visit (INDEPENDENT_AMBULATORY_CARE_PROVIDER_SITE_OTHER): Payer: BC Managed Care – PPO | Admitting: Clinical

## 2020-07-18 DIAGNOSIS — F840 Autistic disorder: Secondary | ICD-10-CM | POA: Diagnosis not present

## 2020-08-01 ENCOUNTER — Ambulatory Visit (INDEPENDENT_AMBULATORY_CARE_PROVIDER_SITE_OTHER): Payer: BC Managed Care – PPO | Admitting: Clinical

## 2020-08-01 DIAGNOSIS — F89 Unspecified disorder of psychological development: Secondary | ICD-10-CM | POA: Diagnosis not present

## 2020-08-14 ENCOUNTER — Ambulatory Visit (INDEPENDENT_AMBULATORY_CARE_PROVIDER_SITE_OTHER): Payer: BC Managed Care – PPO | Admitting: Clinical

## 2020-08-14 DIAGNOSIS — F840 Autistic disorder: Secondary | ICD-10-CM

## 2020-08-14 DIAGNOSIS — F331 Major depressive disorder, recurrent, moderate: Secondary | ICD-10-CM | POA: Diagnosis not present

## 2020-08-28 ENCOUNTER — Ambulatory Visit (INDEPENDENT_AMBULATORY_CARE_PROVIDER_SITE_OTHER): Payer: BC Managed Care – PPO | Admitting: Clinical

## 2020-08-28 DIAGNOSIS — F840 Autistic disorder: Secondary | ICD-10-CM | POA: Diagnosis not present

## 2020-09-10 ENCOUNTER — Ambulatory Visit (INDEPENDENT_AMBULATORY_CARE_PROVIDER_SITE_OTHER): Payer: BC Managed Care – PPO | Admitting: Clinical

## 2020-09-10 DIAGNOSIS — F89 Unspecified disorder of psychological development: Secondary | ICD-10-CM | POA: Diagnosis not present

## 2020-09-11 ENCOUNTER — Ambulatory Visit: Payer: BC Managed Care – PPO | Admitting: Clinical

## 2020-09-24 ENCOUNTER — Ambulatory Visit: Payer: BC Managed Care – PPO | Admitting: Clinical

## 2020-09-25 ENCOUNTER — Ambulatory Visit: Payer: BC Managed Care – PPO | Admitting: Clinical

## 2020-10-09 ENCOUNTER — Ambulatory Visit (INDEPENDENT_AMBULATORY_CARE_PROVIDER_SITE_OTHER): Payer: BC Managed Care – PPO | Admitting: Clinical

## 2020-10-09 DIAGNOSIS — F89 Unspecified disorder of psychological development: Secondary | ICD-10-CM | POA: Diagnosis not present

## 2020-10-10 DIAGNOSIS — Z0000 Encounter for general adult medical examination without abnormal findings: Secondary | ICD-10-CM | POA: Diagnosis not present

## 2020-10-19 DIAGNOSIS — E559 Vitamin D deficiency, unspecified: Secondary | ICD-10-CM | POA: Diagnosis not present

## 2020-10-19 DIAGNOSIS — Z13220 Encounter for screening for lipoid disorders: Secondary | ICD-10-CM | POA: Diagnosis not present

## 2020-10-19 DIAGNOSIS — Z0000 Encounter for general adult medical examination without abnormal findings: Secondary | ICD-10-CM | POA: Diagnosis not present

## 2020-10-23 ENCOUNTER — Ambulatory Visit (INDEPENDENT_AMBULATORY_CARE_PROVIDER_SITE_OTHER): Payer: BC Managed Care – PPO | Admitting: Clinical

## 2020-10-23 DIAGNOSIS — F89 Unspecified disorder of psychological development: Secondary | ICD-10-CM

## 2020-11-06 ENCOUNTER — Ambulatory Visit: Payer: BC Managed Care – PPO | Admitting: Clinical

## 2020-11-19 DIAGNOSIS — R197 Diarrhea, unspecified: Secondary | ICD-10-CM | POA: Diagnosis not present

## 2020-11-19 DIAGNOSIS — R1319 Other dysphagia: Secondary | ICD-10-CM | POA: Diagnosis not present

## 2020-11-20 ENCOUNTER — Ambulatory Visit (INDEPENDENT_AMBULATORY_CARE_PROVIDER_SITE_OTHER): Payer: BC Managed Care – PPO | Admitting: Clinical

## 2020-11-20 DIAGNOSIS — F89 Unspecified disorder of psychological development: Secondary | ICD-10-CM

## 2020-11-21 DIAGNOSIS — R1310 Dysphagia, unspecified: Secondary | ICD-10-CM | POA: Diagnosis not present

## 2020-11-21 DIAGNOSIS — K2930 Chronic superficial gastritis without bleeding: Secondary | ICD-10-CM | POA: Diagnosis not present

## 2020-11-21 DIAGNOSIS — R197 Diarrhea, unspecified: Secondary | ICD-10-CM | POA: Diagnosis not present

## 2020-11-21 DIAGNOSIS — K2289 Other specified disease of esophagus: Secondary | ICD-10-CM | POA: Diagnosis not present

## 2020-12-04 ENCOUNTER — Ambulatory Visit (INDEPENDENT_AMBULATORY_CARE_PROVIDER_SITE_OTHER): Payer: BC Managed Care – PPO | Admitting: Clinical

## 2020-12-04 DIAGNOSIS — F89 Unspecified disorder of psychological development: Secondary | ICD-10-CM | POA: Diagnosis not present

## 2020-12-18 ENCOUNTER — Ambulatory Visit (INDEPENDENT_AMBULATORY_CARE_PROVIDER_SITE_OTHER): Payer: BC Managed Care – PPO | Admitting: Clinical

## 2020-12-18 DIAGNOSIS — F89 Unspecified disorder of psychological development: Secondary | ICD-10-CM | POA: Diagnosis not present

## 2021-01-01 ENCOUNTER — Ambulatory Visit (INDEPENDENT_AMBULATORY_CARE_PROVIDER_SITE_OTHER): Payer: BC Managed Care – PPO | Admitting: Clinical

## 2021-01-01 DIAGNOSIS — F89 Unspecified disorder of psychological development: Secondary | ICD-10-CM | POA: Diagnosis not present

## 2021-01-01 DIAGNOSIS — F331 Major depressive disorder, recurrent, moderate: Secondary | ICD-10-CM

## 2021-01-01 DIAGNOSIS — F71 Moderate intellectual disabilities: Secondary | ICD-10-CM | POA: Diagnosis not present

## 2021-01-15 ENCOUNTER — Ambulatory Visit (INDEPENDENT_AMBULATORY_CARE_PROVIDER_SITE_OTHER): Payer: BC Managed Care – PPO | Admitting: Clinical

## 2021-01-15 DIAGNOSIS — F89 Unspecified disorder of psychological development: Secondary | ICD-10-CM

## 2021-02-13 ENCOUNTER — Ambulatory Visit (INDEPENDENT_AMBULATORY_CARE_PROVIDER_SITE_OTHER): Payer: BC Managed Care – PPO | Admitting: Clinical

## 2021-02-13 DIAGNOSIS — F89 Unspecified disorder of psychological development: Secondary | ICD-10-CM

## 2021-02-16 DIAGNOSIS — F411 Generalized anxiety disorder: Secondary | ICD-10-CM | POA: Diagnosis not present

## 2021-02-16 DIAGNOSIS — F32A Depression, unspecified: Secondary | ICD-10-CM | POA: Diagnosis not present

## 2021-02-27 ENCOUNTER — Ambulatory Visit (INDEPENDENT_AMBULATORY_CARE_PROVIDER_SITE_OTHER): Payer: BC Managed Care – PPO | Admitting: Clinical

## 2021-02-27 DIAGNOSIS — F89 Unspecified disorder of psychological development: Secondary | ICD-10-CM

## 2021-03-13 ENCOUNTER — Ambulatory Visit (INDEPENDENT_AMBULATORY_CARE_PROVIDER_SITE_OTHER): Payer: BC Managed Care – PPO | Admitting: Clinical

## 2021-03-13 DIAGNOSIS — F331 Major depressive disorder, recurrent, moderate: Secondary | ICD-10-CM | POA: Diagnosis not present

## 2021-03-13 DIAGNOSIS — F71 Moderate intellectual disabilities: Secondary | ICD-10-CM

## 2021-03-13 DIAGNOSIS — F840 Autistic disorder: Secondary | ICD-10-CM

## 2021-03-27 ENCOUNTER — Ambulatory Visit (INDEPENDENT_AMBULATORY_CARE_PROVIDER_SITE_OTHER): Payer: BC Managed Care – PPO | Admitting: Clinical

## 2021-03-27 DIAGNOSIS — F840 Autistic disorder: Secondary | ICD-10-CM | POA: Diagnosis not present

## 2021-03-27 DIAGNOSIS — F71 Moderate intellectual disabilities: Secondary | ICD-10-CM | POA: Diagnosis not present

## 2021-03-27 DIAGNOSIS — F331 Major depressive disorder, recurrent, moderate: Secondary | ICD-10-CM | POA: Diagnosis not present

## 2021-04-10 ENCOUNTER — Ambulatory Visit (INDEPENDENT_AMBULATORY_CARE_PROVIDER_SITE_OTHER): Payer: BC Managed Care – PPO | Admitting: Clinical

## 2021-04-10 DIAGNOSIS — F89 Unspecified disorder of psychological development: Secondary | ICD-10-CM

## 2021-04-24 ENCOUNTER — Ambulatory Visit (INDEPENDENT_AMBULATORY_CARE_PROVIDER_SITE_OTHER): Payer: BC Managed Care – PPO | Admitting: Clinical

## 2021-04-24 DIAGNOSIS — F71 Moderate intellectual disabilities: Secondary | ICD-10-CM | POA: Diagnosis not present

## 2021-04-24 DIAGNOSIS — F331 Major depressive disorder, recurrent, moderate: Secondary | ICD-10-CM

## 2021-04-24 DIAGNOSIS — F840 Autistic disorder: Secondary | ICD-10-CM | POA: Diagnosis not present

## 2021-04-26 DIAGNOSIS — J45901 Unspecified asthma with (acute) exacerbation: Secondary | ICD-10-CM | POA: Diagnosis not present

## 2021-04-26 DIAGNOSIS — K2930 Chronic superficial gastritis without bleeding: Secondary | ICD-10-CM | POA: Diagnosis not present

## 2021-04-26 DIAGNOSIS — Z23 Encounter for immunization: Secondary | ICD-10-CM | POA: Diagnosis not present

## 2021-04-27 DIAGNOSIS — F331 Major depressive disorder, recurrent, moderate: Secondary | ICD-10-CM | POA: Diagnosis not present

## 2021-05-22 ENCOUNTER — Ambulatory Visit (INDEPENDENT_AMBULATORY_CARE_PROVIDER_SITE_OTHER): Payer: BC Managed Care – PPO | Admitting: Clinical

## 2021-05-22 DIAGNOSIS — F840 Autistic disorder: Secondary | ICD-10-CM | POA: Diagnosis not present
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
## 2021-05-27 ENCOUNTER — Ambulatory Visit (INDEPENDENT_AMBULATORY_CARE_PROVIDER_SITE_OTHER): Payer: BC Managed Care – PPO | Admitting: Clinical

## 2021-05-27 DIAGNOSIS — F840 Autistic disorder: Secondary | ICD-10-CM | POA: Diagnosis not present

## 2021-05-27 DIAGNOSIS — F331 Major depressive disorder, recurrent, moderate: Secondary | ICD-10-CM | POA: Diagnosis not present

## 2021-05-27 NOTE — Progress Notes (Signed)
Diagnosis: F84.0, F33.2 Time: 11:00am-11:50am CPT: 71062I-94  Felicia [name] was seen remotely using secure video conferencing due to Felicia COVID19 pandemic. She was in her home in [place] and Felicia therapist was in her office at Felicia time of Felicia appointment. She shared that her mood had been stable overall. She continues to see Felicia romantic interest that others in her life have concerns about, but added that she had had productive conversations with her friends and felt they had largely resolved any issues. Therapist talked with her about strategies to ensure her safety, encouraging her toward an open conversation with her mother in order to allow her to support Felicia [name] in being safe. However, Felicia [name] shared no safety concerns. She is scheduled to be seen again in January.  Objectives Related Problem: Felicia [name] experiences symptoms of depression related to difficulty connecting socially with others Description: Felicia [name] will increase her sense of well-being and connectedness and decrease symptoms of depression Target Date: 2021-05-03 Frequency: Biweekly Modality: individual Progress: 0% Planned Intervention: Therapist will provide referrals for additional resources as appropriate  Planned Intervention: Therapist will engage Felicia [name] in behavioral activation, including Felicia incorporation of structure, pleasant events, and mastery activities into her routine  Planned Intervention: Therapist will help Felicia [name] to identify and disengage from maladaptive thought patterns using CBT-based strategies  Planned Intervention: Therapist will provide Felicia [name] with opportunities to process her experiences in session  Planned Intervention: Therapist will provide Felicia [name] with strategies to regulate her emotions, including self-care, mindfulness, meditation, and relaxation exercises  Treatment Plan Client Abilities/Strengths  Felicia [name] presents as motivated to improve her situation.  Client Treatment Preferences  None noted.  Client Statement  of Needs  Felicia [name] is seeking CBT to manage symptoms of depression related to difficulty in social interactions.  Treatment Level  Biweekly  Symptoms  Autism: difficulty perceiving and responding to social cues, engaging in reciprocal conversation (Status: maintained). Depression: sleep irregularity, depressed mood, tearfulness, poor self confidence, feelings of loneliness (Status: maintained).  Problems Addressed  New Description  Goals 1. Sadira experiences symptoms of depression related to difficulty connecting socially with others Objective Felicia [name] will increase her sense of well-being and connectedness and decrease symptoms of depression Target Date: 2021-05-03 Frequency: Biweekly  Progress: 0 Modality: individual  Related Interventions Therapist will provide referrals for additional resources as appropriate Therapist will work with Felicia [name] to develop her social skills incorporating role-play exercises and video modeling, such as through Felicia Felicia [name] role play videos Therapist will engage Felicia [name] in behavioral activation, including Felicia incorporation of structure, pleasant events, and mastery activities into her routine Therapist will provide Felicia [name] with strategies to regulate her emotions, including self-care, mindfulness, meditation, and relaxation exercises Therapist will help Felicia [name] to identify and disengage from maladaptive thought patterns using CBT-based strategies Therapist will provide Felicia [name] with opportunities to process her experiences in session Diagnosis Axis none 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

## 2021-06-05 ENCOUNTER — Ambulatory Visit: Payer: BC Managed Care – PPO | Admitting: Clinical

## 2021-06-19 ENCOUNTER — Ambulatory Visit (INDEPENDENT_AMBULATORY_CARE_PROVIDER_SITE_OTHER): Payer: BC Managed Care – PPO | Admitting: Clinical

## 2021-06-19 DIAGNOSIS — F840 Autistic disorder: Secondary | ICD-10-CM

## 2021-06-19 NOTE — Progress Notes (Signed)
Diagnosis: F84.0, F33.2 Time: 11:00am-11:50am CPT: [id number]-37  Leara was seen remotely using secure video conferencing due to the [employer] pandemic. She was in her home in [place] and the therapist was in her office at the time of the appointment. Her mother joined the beginning of the appointment and shared that concerns with Shauntavia's new interest had escalated among family and friends. Therapist met with Elyanna, who shared that she has found the repeated voicing of concern to prevent her from opening up to friends and family, which has had an isolating effect. Therapist worked with her to consider how to communicate this to her loved ones. Therapist also engaged her in a review and update of her treatment plan. Suicidal ideation and intent were denied. She is scheduled to be seen again in two weeks.  Objectives Related Problem: Briawna experiences symptoms of depression related to difficulty connecting socially with others Description: Beya will increase her sense of well-being and connectedness and decrease symptoms of depression Target Date: 2022-05-03 Frequency: Biweekly Modality: individual Progress: 0% Planned Intervention: Therapist will provide referrals for additional resources as appropriate  Planned Intervention: Therapist will engage Samadhi in behavioral activation, including the incorporation of structure, pleasant events, and mastery activities into her routine  Planned Intervention: Therapist will help Shelita to identify and disengage from maladaptive thought and behavior patterns using CBT-based strategies  Planned Intervention: Therapist will provide Ezell with opportunities to process her experiences in session  Planned Intervention: Therapist will provide Takirah with strategies to regulate her emotions, including self-care, mindfulness, meditation, and relaxation exercises  Treatment Plan Client Abilities/Strengths  Nobie presents as motivated to improve her situation.  Client  Treatment Preferences  None noted.  Client Statement of Needs  Elara is seeking CBT to manage symptoms of depression related to difficulty in social interactions.  Treatment Level  Biweekly  Symptoms  Autism: difficulty perceiving and responding to social cues, engaging in reciprocal conversation (Status: maintained). Depression: sleep irregularity, depressed mood, tearfulness, poor self confidence, feelings of loneliness (Status: maintained).  Problems Addressed  New Description  Goals 1. Lianah experiences symptoms of depression related to difficulty connecting socially with others Objective Azarria will increase her sense of well-being and connectedness and decrease symptoms of depression Target Date: 2021-05-03 Frequency: Biweekly  Progress: 0 Modality: individual  Related Interventions Therapist will provide referrals for additional resources as appropriate Therapist will work with Fallynn to develop her social skills incorporating role-play exercises and video modeling, such as through the [employer] role play videos Therapist will engage Shanyah in behavioral activation, including the incorporation of structure, pleasant events, and mastery activities into her routine Therapist will provide Briawna with strategies to regulate her emotions, including self-care, mindfulness, meditation, and relaxation exercises Therapist will help Janyce to identify and disengage from maladaptive thought patterns using CBT-based strategies Therapist will provide Freedom with opportunities to process her experiences in session Diagnosis Axis none 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

## 2021-06-29 DIAGNOSIS — F331 Major depressive disorder, recurrent, moderate: Secondary | ICD-10-CM | POA: Diagnosis not present

## 2021-07-03 ENCOUNTER — Ambulatory Visit (INDEPENDENT_AMBULATORY_CARE_PROVIDER_SITE_OTHER): Payer: BC Managed Care – PPO | Admitting: Clinical

## 2021-07-03 DIAGNOSIS — F840 Autistic disorder: Secondary | ICD-10-CM

## 2021-07-03 NOTE — Progress Notes (Signed)
Diagnosis: F84.0, F33.2 Time: 11:05am-11:50am CPT: [id number]  Felicia [name] was seen remotely using secure video conferencing due to the COVID19 pandemic. She was in her home in [place] and the therapist was in her office at the time of the appointment. Her mother joined the beginning of the appointment and shared that she had decided to step away from the situation with Felicia [name] and her boyfriend. Felicia [name] then joined the session. She shared that her depressive symptoms have increased of late, mostly in the form of irritability, and upon further discussion speculated that this may be due to two friends moving further away. For homework, she will make plans with at least one friend before her next session. She is scheduled to be seen again in two weeks.  Objectives Related Problem: Felicia [name] experiences symptoms of depression related to difficulty connecting socially with others Description: Felicia [name] will increase her sense of well-being and connectedness and decrease symptoms of depression Target Date: 2022-05-03 Frequency: Biweekly Modality: individual Progress: 0% Planned Intervention: Therapist will provide referrals for additional resources as appropriate  Planned Intervention: Therapist will engage Felicia [name] in behavioral activation, including the incorporation of structure, pleasant events, and mastery activities into her routine  Planned Intervention: Therapist will help Felicia [name] to identify and disengage from maladaptive thought and behavior patterns using CBT-based strategies  Planned Intervention: Therapist will provide Felicia [name] with opportunities to process her experiences in session  Planned Intervention: Therapist will provide Felicia [name] with strategies to regulate her emotions, including self-care, mindfulness, meditation, and relaxation exercises  Treatment Plan Client Abilities/Strengths  Felicia [name] presents as motivated to improve her situation.  Client Treatment Preferences  None noted.  Client Statement of  Needs  Felicia [name] is seeking CBT to manage symptoms of depression related to difficulty in social interactions.  Treatment Level  Biweekly  Symptoms  Autism: difficulty perceiving and responding to social cues, engaging in reciprocal conversation (Status: maintained). Depression: sleep irregularity, depressed mood, tearfulness, poor self confidence, feelings of loneliness (Status: maintained).  Problems Addressed  New Description  Goals 1. Felicia [name] experiences symptoms of depression related to difficulty connecting socially with others Objective Felicia [name] will increase her sense of well-being and connectedness and decrease symptoms of depression Target Date: 2021-05-03 Frequency: Biweekly  Progress: 0 Modality: individual  Related Interventions Therapist will provide referrals for additional resources as appropriate Therapist will work with Felicia [name] to develop her social skills incorporating role-play exercises and video modeling, such as through the [employer] role play videos Therapist will engage Felicia [name] in behavioral activation, including the incorporation of structure, pleasant events, and mastery activities into her routine Therapist will provide Felicia [name] with strategies to regulate her emotions, including self-care, mindfulness, meditation, and relaxation exercises Therapist will help Felicia [name] to identify and disengage from maladaptive thought patterns using CBT-based strategies Therapist will provide Felicia [name] with opportunities to process her experiences in session Diagnosis Axis none 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

## 2021-07-17 ENCOUNTER — Ambulatory Visit (INDEPENDENT_AMBULATORY_CARE_PROVIDER_SITE_OTHER): Payer: BC Managed Care – PPO | Admitting: Clinical

## 2021-07-17 DIAGNOSIS — F331 Major depressive disorder, recurrent, moderate: Secondary | ICD-10-CM | POA: Diagnosis not present

## 2021-07-17 DIAGNOSIS — F840 Autistic disorder: Secondary | ICD-10-CM

## 2021-07-17 NOTE — Progress Notes (Signed)
Diagnosis: F84.0, F33.2 Time: 11:05am-11:55am CPT: [id number]  Felicia [name] was seen remotely using secure video conferencing due to the COVID19 pandemic. She was in her home in [place] and the therapist was in her home at the time of the appointment. She shared that she continues to struggle with an increase in irritability and sadness related to what she believes has been a depressive episode. Therapist worked with her to consider how to continue to engage in relationships with others even when she feels like isolating, as well as other behavioral activation strategies she can incorporate. For homework, she will try to take regular walks outside during which she listens to music she enjoys. She is scheduled to be seen again in two weeks.  Objectives Related Problem: Felicia [name] experiences symptoms of depression related to difficulty connecting socially with others Description: Felicia [name] will increase her sense of well-being and connectedness and decrease symptoms of depression Target Date: 2022-05-03 Frequency: Biweekly Modality: individual Progress: 0% Planned Intervention: Therapist will provide referrals for additional resources as appropriate  Planned Intervention: Therapist will engage Felicia [name] in behavioral activation, including the incorporation of structure, pleasant events, and mastery activities into her routine  Planned Intervention: Therapist will help Felicia [name] to identify and disengage from maladaptive thought and behavior patterns using CBT-based strategies  Planned Intervention: Therapist will provide Felicia [name] with opportunities to process her experiences in session  Planned Intervention: Therapist will provide Felicia [name] with strategies to regulate her emotions, including self-care, mindfulness, meditation, and relaxation exercises  Treatment Plan Client Abilities/Strengths  Felicia [name] presents as motivated to improve her situation.  Client Treatment Preferences  None noted.  Client Statement of Needs   Felicia [name] is seeking CBT to manage symptoms of depression related to difficulty in social interactions.  Treatment Level  Biweekly  Symptoms  Autism: difficulty perceiving and responding to social cues, engaging in reciprocal conversation (Status: maintained). Depression: sleep irregularity, depressed mood, tearfulness, poor self confidence, feelings of loneliness (Status: maintained).  Problems Addressed  New Description  Goals 1. Felicia [name] experiences symptoms of depression related to difficulty connecting socially with others Objective Felicia [name] will increase her sense of well-being and connectedness and decrease symptoms of depression Target Date: 2021-05-03 Frequency: Biweekly  Progress: 0 Modality: individual  Related Interventions Therapist will provide referrals for additional resources as appropriate Therapist will work with [name] to develop her social skills incorporating role-play exercises and video modeling, such as through the [employer] role play videos Therapist will engage [name] in behavioral activation, including the incorporation of structure, pleasant events, and mastery activities into her routine Therapist will provide [name] with strategies to regulate her emotions, including self-care, mindfulness, meditation, and relaxation exercises Therapist will help [name] to identify and disengage from maladaptive thought patterns using CBT-based strategies Therapist will provide [name] with opportunities to process her experiences in session Diagnosis Axis none 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

## 2021-07-26 DIAGNOSIS — J309 Allergic rhinitis, unspecified: Secondary | ICD-10-CM | POA: Diagnosis not present

## 2021-07-26 DIAGNOSIS — J4520 Mild intermittent asthma, uncomplicated: Secondary | ICD-10-CM | POA: Diagnosis not present

## 2021-07-26 DIAGNOSIS — F909 Attention-deficit hyperactivity disorder, unspecified type: Secondary | ICD-10-CM | POA: Diagnosis not present

## 2021-07-31 ENCOUNTER — Ambulatory Visit (INDEPENDENT_AMBULATORY_CARE_PROVIDER_SITE_OTHER): Payer: BC Managed Care – PPO | Admitting: Clinical

## 2021-07-31 DIAGNOSIS — F840 Autistic disorder: Secondary | ICD-10-CM

## 2021-07-31 NOTE — Progress Notes (Signed)
Diagnosis: F84.0, F33.2 Time: 11:03am-11:50am CPT: [id number]  Lakedra was seen remotely using secure video conferencing due to the COVID19 pandemic. She was in her home in [place] and the therapist was in her office at the time of the appointment. 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 Therapist processed several challenging situations in relationships and helped Rushie consider how to communicate her feelings. She is scheduled to be seen again in two weeks.   Objectives Related Problem: Zakyah experiences symptoms of depression related to difficulty connecting socially with others Description: Saramarie will increase her sense of well-being and connectedness and decrease symptoms of depression Target Date: 2022-05-03 Frequency: Biweekly Modality: individual Progress: 0% Planned Intervention: Therapist will provide referrals for additional resources as appropriate  Planned Intervention: Therapist will engage Leeann in behavioral activation, including the incorporation of structure, pleasant events, and mastery activities into her routine  Planned Intervention: Therapist will help Eudelia to identify and disengage from maladaptive thought and behavior patterns using CBT-based strategies  Planned Intervention: Therapist will provide Leenah with opportunities to process her experiences in session  Planned Intervention: Therapist will provide Haniya with strategies to regulate her emotions, including self-care, mindfulness, meditation, and relaxation exercises  Treatment Plan Client Abilities/Strengths  Ellenora presents as motivated to improve her situation.  Client Treatment Preferences  None noted.  Client Statement of Needs  Melissaann is seeking CBT to manage symptoms of depression related to difficulty in social interactions.   Treatment Level  Biweekly  Symptoms  Autism: difficulty perceiving and responding to social cues, engaging in reciprocal conversation (Status: maintained). Depression: sleep irregularity, depressed mood, tearfulness, poor self confidence, feelings of loneliness (Status: maintained).  Problems Addressed  New Description  Goals 1. Ralph experiences symptoms of depression related to difficulty connecting socially with others Objective Leoma will increase her sense of well-being and connectedness and decrease symptoms of depression Target Date: 2021-05-03 Frequency: Biweekly  Progress: 0 Modality: individual  Related Interventions Therapist will provide referrals for additional resources as appropriate Therapist will work with Otha to develop her social skills incorporating role-play exercises and video modeling, such as through the [employer] role play videos Therapist will engage Anniebelle in behavioral activation, including the incorporation of structure, pleasant events, and mastery activities into her routine Therapist will provide Marai with strategies to regulate her emotions, including self-care, mindfulness, meditation, and relaxation exercises Therapist will help Derra to identify and disengage from maladaptive thought patterns using CBT-based strategies Therapist will provide Aissata with opportunities to process her experiences in session Diagnosis Axis none 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

## 2021-08-14 ENCOUNTER — Ambulatory Visit (INDEPENDENT_AMBULATORY_CARE_PROVIDER_SITE_OTHER): Payer: BC Managed Care – PPO | Admitting: Clinical

## 2021-08-14 DIAGNOSIS — F840 Autistic disorder: Secondary | ICD-10-CM | POA: Diagnosis not present

## 2021-08-14 NOTE — Progress Notes (Addendum)
Diagnosis: F84.0, F33.2 ?Time: 11:08am-11:50am ?CPT: [id number] ? ?Felicia [name] was seen remotely using secure video conferencing due to the [place] pandemic. She was in her home in [place] and the therapist was in her home at the time of the appointment. 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 Session focused on recent events in her relationship and friendships, with the therapist offering validation, support, and communication strategies. She is scheduled to be seen again in two weeks. ? ?Objectives ?Related Problem: Felicia [name] experiences symptoms of depression related to difficulty connecting socially with others ?Description: Felicia [name] will increase her sense of well-being and connectedness and decrease symptoms of depression ?Target Date: 2022-05-03 ?Frequency: Biweekly ?Modality: individual ?Progress: 0% ?Planned Intervention: Therapist will provide referrals for additional resources as appropriate  ?Planned Intervention: Therapist will engage Felicia [name] in behavioral activation, including the incorporation of structure, pleasant events, and mastery activities into her routine  ?Planned Intervention: Therapist will help Felicia [name] to identify and disengage from maladaptive thought and behavior patterns using CBT-based strategies  ?Planned Intervention: Therapist will provide Felicia [name] with opportunities to process her experiences in session  ?Planned Intervention: Therapist will provide Felicia [name] with strategies to regulate her emotions, including self-care, mindfulness, meditation, and relaxation exercises  ?Treatment Plan ?Client Abilities/Strengths  ?Felicia [name] presents as motivated to improve her situation.  ?Client Treatment Preferences  ?None noted.  ?Client Statement of Needs  ?Felicia [name] is seeking CBT to manage symptoms of depression related to difficulty in social interactions.  ?Treatment Level   ?Biweekly  ?Symptoms  ?Autism: difficulty perceiving and responding to social cues, engaging in reciprocal conversation (Status: maintained). Depression: sleep irregularity, depressed mood, tearfulness, poor self confidence, feelings of loneliness (Status: maintained).  ?Problems Addressed  ?New Description  ?Goals ?1. Felicia [name] experiences symptoms of depression related to difficulty connecting socially with others ?Objective ?Felicia [name] will increase her sense of well-being and connectedness and decrease symptoms of depression ?Target Date: 2022-05-03 Frequency: Biweekly  ?Progress: 0 Modality: individual  ?Related Interventions ?Therapist will provide referrals for additional resources as appropriate ?Therapist will work with Felicia [name] to develop her social skills incorporating role-play exercises and video modeling, such as through the [employer] role play videos ?Therapist will engage Felicia [name] in behavioral activation, including the incorporation of structure, pleasant events, and mastery activities into her routine ?Therapist will provide Felicia [name] with strategies to regulate her emotions, including self-care, mindfulness, meditation, and relaxation exercises ?Therapist will help Felicia [name] to identify and disengage from maladaptive thought patterns using CBT-based strategies ?Therapist will provide Felicia [name] with opportunities to process her experiences in session ?Diagnosis ?Axis 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 ? ? ?[name], PhD ? ? ? ? ? ? ? ? ? ? ? ? ? ? ?[name], PhD ? ? ? ? ? ? ? ? ? ? ? ? ? ? ?[name], PhD ?

## 2021-08-28 ENCOUNTER — Ambulatory Visit (INDEPENDENT_AMBULATORY_CARE_PROVIDER_SITE_OTHER): Payer: BC Managed Care – PPO | Admitting: Clinical

## 2021-08-28 DIAGNOSIS — F840 Autistic disorder: Secondary | ICD-10-CM

## 2021-08-28 NOTE — Progress Notes (Signed)
Diagnosis: F84.0, F33.2 ?Time: 11:13 am-11:51am ?CPT: [id number] ? ?Felicia [name] was seen remotely using secure video conferencing due to the [place] pandemic. She was in her home in [place] and the therapist was in her office at the time of the appointment. She shared that was especially tired that day and had been sleeping. She reported a challenging two weeks during which a close friend had asked to pause their friendship due to romantic feelings for her, only to contact her again and let her know that he would like to continue the friendship. She also expressed some frustration with the difficulty remaining in contact with her boyfriend. Therapist suggested a conversation about a regularly scheduled time for phone calls, which Felicia [name] expressed interest in, and provided communication strategies. She is scheduled to be seen again in two weeks. ? ? ?Objectives ?Related Problem: Felicia [name] experiences symptoms of depression related to difficulty connecting socially with others ?Description: Felicia [name] will increase her sense of well-being and connectedness and decrease symptoms of depression ?Target Date: 2022-05-03 ?Frequency: Biweekly ?Modality: individual ?Progress: 0% ?Planned Intervention: Therapist will provide referrals for additional resources as appropriate  ?Planned Intervention: Therapist will engage Felicia [name] in behavioral activation, including the incorporation of structure, pleasant events, and mastery activities into her routine  ?Planned Intervention: Therapist will help Felicia [name] to identify and disengage from maladaptive thought and behavior patterns using CBT-based strategies  ?Planned Intervention: Therapist will provide Felicia [name] with opportunities to process her experiences in session  ?Planned Intervention: Therapist will provide Felicia [name] with strategies to regulate her emotions, including self-care, mindfulness, meditation, and relaxation exercises  ?Treatment Plan ?Client Abilities/Strengths  ?Felicia [name] presents as  motivated to improve her situation.  ?Client Treatment Preferences  ?None noted.  ?Client Statement of Needs  ?Felicia [name] is seeking CBT to manage symptoms of depression related to difficulty in social interactions.  ?Treatment Level  ?Biweekly  ?Symptoms  ?Autism: difficulty perceiving and responding to social cues, engaging in reciprocal conversation (Status: maintained). Depression: sleep irregularity, depressed mood, tearfulness, poor self confidence, feelings of loneliness (Status: maintained).  ?Problems Addressed  ?New Description  ?Goals ?1. Felicia [name] experiences symptoms of depression related to difficulty connecting socially with others ?Objective ?Felicia [name] will increase her sense of well-being and connectedness and decrease symptoms of depression ?Target Date: 2022-05-03 Frequency: Biweekly  ?Progress: 0 Modality: individual  ?Related Interventions ?Therapist will provide referrals for additional resources as appropriate ?Therapist will work with Felicia [name] to develop her social skills incorporating role-play exercises and video modeling, such as through the [employer] role play videos ?Therapist will engage Ilea in behavioral activation, including the incorporation of structure, pleasant events, and mastery activities into her routine ?Therapist will provide Almeter with strategies to regulate her emotions, including self-care, mindfulness, meditation, and relaxation exercises ?Therapist will help Felicia [name] to identify and disengage from maladaptive thought patterns using CBT-based strategies ?Therapist will provide Felicia [name] with opportunities to process her experiences in session ?Diagnosis ?Axis 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 ? ? ?[name], PhD ? ? ? ? ? ? ? ? ? ? ? ? ? ? ?[name], PhD ? ? ? ? ? ? ? ? ? ? ? ? ? ? ?[name], PhD ? ? ? ? ? ? ? ? ? ? ? ? ? ? ?[place]  Jennette Banker, PhD ?

## 2021-09-11 ENCOUNTER — Ambulatory Visit (INDEPENDENT_AMBULATORY_CARE_PROVIDER_SITE_OTHER): Payer: BC Managed Care – PPO | Admitting: Clinical

## 2021-09-11 DIAGNOSIS — F840 Autistic disorder: Secondary | ICD-10-CM

## 2021-09-11 NOTE — Progress Notes (Signed)
Diagnosis: F84.0, F33.2 ?Time: 11:01 am-11:51am ?CPT: [id number] ? ?Felicia [name] was seen remotely using secure video conferencing due to the COVID19 pandemic. She was in her home in [place] and the therapist was in her office at the time of the appointment. She shared that she has been doing well overall, and her mood has been slightly happier in recent weeks since starting her new job, which she has been enjoying. She reflected upon recent developments in her relationships, and therapist engaged her in a discussion of communication strategies for the specific situations she described. Therapist checked in as to food cravings and irritability that had been previously reported, and Felicia [name] reported that she continues to experience food cravings and when these arise she often eats until she vomits or feels a need to vomit. Therapist encouraged her to discuss this with her PCP and psychiatrist. She is scheduled to be seen again in two weeks. ? ? ?Objectives ?Related Problem: Felicia [name] experiences symptoms of depression related to difficulty connecting socially with others ?Description: Felicia [name] will increase her sense of well-being and connectedness and decrease symptoms of depression ?Target Date: 2022-05-03 ?Frequency: Biweekly ?Modality: individual ?Progress: 0% ?Planned Intervention: Therapist will provide referrals for additional resources as appropriate  ?Planned Intervention: Therapist will engage Felicia [name] in behavioral activation, including the incorporation of structure, pleasant events, and mastery activities into her routine  ?Planned Intervention: Therapist will help Felicia [name] to identify and disengage from maladaptive thought and behavior patterns using CBT-based strategies  ?Planned Intervention: Therapist will provide Felicia [name] with opportunities to process her experiences in session  ?Planned Intervention: Therapist will provide Felicia [name] with strategies to regulate her emotions, including self-care, mindfulness, meditation,  and relaxation exercises  ?Treatment Plan ?Client Abilities/Strengths  ?Felicia [name] presents as motivated to improve her situation.  ?Client Treatment Preferences  ?None noted.  ?Client Statement of Needs  ?Felicia [name] is seeking CBT to manage symptoms of depression related to difficulty in social interactions.  ?Treatment Level  ?Biweekly  ?Symptoms  ?Autism: difficulty perceiving and responding to social cues, engaging in reciprocal conversation (Status: maintained). Depression: sleep irregularity, depressed mood, tearfulness, poor self confidence, feelings of loneliness (Status: maintained).  ?Problems Addressed  ?New Description  ?Goals ?1. Felicia [name] experiences symptoms of depression related to difficulty connecting socially with others ?Objective ?Felicia [name] will increase her sense of well-being and connectedness and decrease symptoms of depression ?Target Date: 2022-05-03 Frequency: Biweekly  ?Progress: 0 Modality: individual  ?Related Interventions ?Therapist will provide referrals for additional resources as appropriate ?Therapist will work with Felicia [name] to develop her social skills incorporating role-play exercises and video modeling, such as through the [employer] role play videos ?Therapist will engage Felicia [name] in behavioral activation, including the incorporation of structure, pleasant events, and mastery activities into her routine ?Therapist will provide Felicia [name] with strategies to regulate her emotions, including self-care, mindfulness, meditation, and relaxation exercises ?Therapist will help Felicia [name] to identify and disengage from maladaptive thought patterns using CBT-based strategies ?Therapist will provide Felicia [name] with opportunities to process her experiences in session ?Diagnosis ?Axis 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 ? ? ?Felicia Noa, PhD ? ? ? ? ? ? ? ? ? ? ? ? ? ? ?Felicia Noa, PhD ? ? ? ? ? ? ? ? ? ? ? ? ? ? ?Felicia [name]  Felicia Gable, PhD ? ? ? ? ? ? ? ? ? ? ? ? ? ? ?Felicia Noa, PhD ? ? ? ? ? ? ? ? ? ? ? ? ? ? ?Felicia Noa, PhD ?

## 2021-09-25 ENCOUNTER — Ambulatory Visit (INDEPENDENT_AMBULATORY_CARE_PROVIDER_SITE_OTHER): Payer: BC Managed Care – PPO | Admitting: Clinical

## 2021-09-25 DIAGNOSIS — F840 Autistic disorder: Secondary | ICD-10-CM

## 2021-09-25 NOTE — Progress Notes (Signed)
Diagnosis: F84.0, F33.2 ?Time: 11:07 am-11:51am ?CPT: [id number] ? ?Felicia [name] was seen remotely using secure video conferencing due to the COVID19 pandemic. She was in her home in [place] and the therapist was in her office at the time of the appointment. She shared that she had had a challenging two weeks during which rumors had once again circulated about her boyfriend. Therapist processed this with her, offering communication strategies. She is scheduled to be seen again in two weeks. ?Objectives ?Related Problem: Felicia [name] experiences symptoms of depression related to difficulty connecting socially with others ?Description: Felicia [name] will increase her sense of well-being and connectedness and decrease symptoms of depression ?Target Date: 2022-05-03 ?Frequency: Biweekly ?Modality: individual ?Progress: 0% ?Planned Intervention: Therapist will provide referrals for additional resources as appropriate  ?Planned Intervention: Therapist will engage Felicia [name] in behavioral activation, including the incorporation of structure, pleasant events, and mastery activities into her routine  ?Planned Intervention: Therapist will help Felicia [name] to identify and disengage from maladaptive thought and behavior patterns using CBT-based strategies  ?Planned Intervention: Therapist will provide Felicia [name] with opportunities to process her experiences in session  ?Planned Intervention: Therapist will provide Felicia [name] with strategies to regulate her emotions, including self-care, mindfulness, meditation, and relaxation exercises  ?Treatment Plan ?Client Abilities/Strengths  ?Felicia [name] presents as motivated to improve her situation.  ?Client Treatment Preferences  ?None noted.  ?Client Statement of Needs  ?Felicia [name] is seeking CBT to manage symptoms of depression related to difficulty in social interactions.  ?Treatment Level  ?Biweekly  ?Symptoms  ?Autism: difficulty perceiving and responding to social cues, engaging in reciprocal conversation (Status: maintained).  Depression: sleep irregularity, depressed mood, tearfulness, poor self confidence, feelings of loneliness (Status: maintained).  ?Problems Addressed  ?New Description  ?Goals ?1. Felicia [name] experiences symptoms of depression related to difficulty connecting socially with others ?Objective ?Felicia [name] will increase her sense of well-being and connectedness and decrease symptoms of depression ?Target Date: 2022-05-03 Frequency: Biweekly  ?Progress: 0 Modality: individual  ?Related Interventions ?Therapist will provide referrals for additional resources as appropriate ?Therapist will work with [name] to develop her social skills incorporating role-play exercises and video modeling, such as through the [employer] role play videos ?Therapist will engage [name] in behavioral activation, including the incorporation of structure, pleasant events, and mastery activities into her routine ?Therapist will provide Felicia [name] with strategies to regulate her emotions, including self-care, mindfulness, meditation, and relaxation exercises ?Therapist will help Felicia [name] to identify and disengage from maladaptive thought patterns using CBT-based strategies ?Therapist will provide Felicia [name] with opportunities to process her experiences in session ?Diagnosis ?Axis 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 ? ? ?[name], PhD ? ? ? ? ? ? ? ? ? ? ? ? ? ? ?[name], PhD ? ? ? ? ? ? ? ? ? ? ? ? ? ? ?[name], PhD ? ? ? ? ? ? ? ? ? ? ? ? ? ? ?[name], PhD ? ? ? ? ? ? ? ? ? ? ? ? ? ? ?[name], PhD ? ? ? ? ? ? ? ? ? ? ? ? ? ? ?[name], PhD ?

## 2021-09-28 DIAGNOSIS — F331 Major depressive disorder, recurrent, moderate: Secondary | ICD-10-CM | POA: Diagnosis not present

## 2021-10-09 ENCOUNTER — Ambulatory Visit (INDEPENDENT_AMBULATORY_CARE_PROVIDER_SITE_OTHER): Payer: BC Managed Care – PPO | Admitting: Clinical

## 2021-10-09 DIAGNOSIS — F840 Autistic disorder: Secondary | ICD-10-CM | POA: Diagnosis not present

## 2021-10-09 NOTE — Progress Notes (Signed)
Diagnosis: F84.0, F33.2 ?Time: 11:01 am-11:51am ?CPT: [id number] ? ?Ellen was seen remotely using secure video conferencing due to the COVID19 pandemic. She was in her home in [place] and the therapist was in her office at the time of the appointment. She shared that she and her boyfriend had had a falling out after she had told him that she "doesn't care about" his day. Session focused on trying to understand what she had intended to communicate and consider how she might clarify her comments, as well as considering a general framework for communication with others when she accidentally offends with her words. Evanna is scheduled to be seen again in two weeks. ?Objectives ?Related Problem: Kaylaann experiences symptoms of depression related to difficulty connecting socially with others ?Description: Klarissa will increase her sense of well-being and connectedness and decrease symptoms of depression ?Target Date: 2022-05-03 ?Frequency: Biweekly ?Modality: individual ?Progress: 0% ?Planned Intervention: Therapist will provide referrals for additional resources as appropriate  ?Planned Intervention: Therapist will engage Jerusalen in behavioral activation, including the incorporation of structure, pleasant events, and mastery activities into her routine  ?Planned Intervention: Therapist will help Zailah to identify and disengage from maladaptive thought and behavior patterns using CBT-based strategies  ?Planned Intervention: Therapist will provide Elliett with opportunities to process her experiences in session  ?Planned Intervention: Therapist will provide Trystan with strategies to regulate her emotions, including self-care, mindfulness, meditation, and relaxation exercises  ?Treatment Plan ?Client Abilities/Strengths  ?Meron presents as motivated to improve her situation.  ?Client Treatment Preferences  ?None noted.  ?Client Statement of Needs  ?[place] is seeking CBT to manage symptoms of depression related to difficulty in  social interactions.  ?Treatment Level  ?Biweekly  ?Symptoms  ?Autism: difficulty perceiving and responding to social cues, engaging in reciprocal conversation (Status: maintained). Depression: sleep irregularity, depressed mood, tearfulness, poor self confidence, feelings of loneliness (Status: maintained).  ?Problems Addressed  ?New Description  ?Goals ?1. Nalia experiences symptoms of depression related to difficulty connecting socially with others ?Objective ?Jakara will increase her sense of well-being and connectedness and decrease symptoms of depression ?Target Date: 2022-05-03 Frequency: Biweekly  ?Progress: 0 Modality: individual  ?Related Interventions ?Therapist will provide referrals for additional resources as appropriate ?Therapist will work with Ninnie to develop her social skills incorporating role-play exercises and video modeling, such as through the [employer] role play videos ?Therapist will engage Endya in behavioral activation, including the incorporation of structure, pleasant events, and mastery activities into her routine ?Therapist will provide Pierina with strategies to regulate her emotions, including self-care, mindfulness, meditation, and relaxation exercises ?Therapist will help Starlynn to identify and disengage from maladaptive thought patterns using CBT-based strategies ?Therapist will provide Briyonna with opportunities to process her experiences in session ?Diagnosis ?Axis 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 ? ? ?[name], PhD ? ? ? ? ? ? ? ? ? ? ? ? ? ? ?[name], PhD ? ? ? ? ? ? ? ? ? ? ? ? ? ? ?[name], PhD ? ? ? ? ? ? ? ? ? ? ? ? ? ? ?[name], PhD ? ? ? ? ? ? ? ? ? ? ? ? ? ? ?[name],  PhD ? ? ? ? ? ? ? ? ? ? ? ? ? ? ?  [name], PhD ? ? ? ? ? ? ? ? ? ? ? ? ? ? ?[name], PhD ?

## 2021-10-16 DIAGNOSIS — Z0000 Encounter for general adult medical examination without abnormal findings: Secondary | ICD-10-CM | POA: Diagnosis not present

## 2021-10-16 DIAGNOSIS — E559 Vitamin D deficiency, unspecified: Secondary | ICD-10-CM | POA: Diagnosis not present

## 2021-10-16 DIAGNOSIS — Z13220 Encounter for screening for lipoid disorders: Secondary | ICD-10-CM | POA: Diagnosis not present

## 2021-10-23 ENCOUNTER — Ambulatory Visit (INDEPENDENT_AMBULATORY_CARE_PROVIDER_SITE_OTHER): Payer: BC Managed Care – PPO | Admitting: Clinical

## 2021-10-23 DIAGNOSIS — F331 Major depressive disorder, recurrent, moderate: Secondary | ICD-10-CM | POA: Diagnosis not present

## 2021-10-23 DIAGNOSIS — F840 Autistic disorder: Secondary | ICD-10-CM

## 2021-10-23 NOTE — Progress Notes (Signed)
Diagnosis: F84.0, F33.2 ?Time: 11:01 am-11:51am ?CPT: [id number] ? ?Felicia [name] was seen remotely using secure video conferencing due to the COVID19 pandemic. She was in her home in [place] and the therapist was in her office at the time of the appointment. She shared that she has experienced an improvement in mood over the past two weeks, and was able to resolve issues with her boyfriend. Session focused on ongoing issues in her friend group. Therapist provided an opportunity to process several situations, and suggested communication strategies. Therapist also suggested checking in with her PCP about ongoing food cravings. She is scheduled to be seen again in two weeks. ? ? ?Objectives ?Related Problem: Felicia [name] experiences symptoms of depression related to difficulty connecting socially with others ?Description: Felicia [name] will increase her sense of well-being and connectedness and decrease symptoms of depression ?Target Date: 2022-05-03 ?Frequency: Biweekly ?Modality: individual ?Progress: 0% ?Planned Intervention: Therapist will provide referrals for additional resources as appropriate  ?Planned Intervention: Therapist will engage Felicia [name] in behavioral activation, including the incorporation of structure, pleasant events, and mastery activities into her routine  ?Planned Intervention: Therapist will help Felicia [name] to identify and disengage from maladaptive thought and behavior patterns using CBT-based strategies  ?Planned Intervention: Therapist will provide Felicia [name] with opportunities to process her experiences in session  ?Planned Intervention: Therapist will provide Felicia [name] with strategies to regulate her emotions, including self-care, mindfulness, meditation, and relaxation exercises  ?Treatment Plan ?Client Abilities/Strengths  ?Felicia [name] presents as motivated to improve her situation.  ?Client Treatment Preferences  ?None noted.  ?Client Statement of Needs  ?Felicia [name] is seeking CBT to manage symptoms of depression related to  difficulty in social interactions.  ?Treatment Level  ?Biweekly  ?Symptoms  ?Autism: difficulty perceiving and responding to social cues, engaging in reciprocal conversation (Status: maintained). Depression: sleep irregularity, depressed mood, tearfulness, poor self confidence, feelings of loneliness (Status: maintained).  ?Problems Addressed  ?New Description  ?Goals ?1. Felicia [name] experiences symptoms of depression related to difficulty connecting socially with others ?Objective ?Felicia [name] will increase her sense of well-being and connectedness and decrease symptoms of depression ?Target Date: 2022-05-03 Frequency: Biweekly  ?Progress: 0 Modality: individual  ?Related Interventions ?Therapist will provide referrals for additional resources as appropriate ?Therapist will work with Felicia [name] to develop her social skills incorporating role-play exercises and video modeling, such as through the [employer] role play videos ?Therapist will engage Felicia [name] in behavioral activation, including the incorporation of structure, pleasant events, and mastery activities into her routine ?Therapist will provide Felicia [name] with strategies to regulate her emotions, including self-care, mindfulness, meditation, and relaxation exercises ?Therapist will help Felicia [name] to identify and disengage from maladaptive thought patterns using CBT-based strategies ?Therapist will provide Felicia [name] with opportunities to process her experiences in session ?Diagnosis ?Axis 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 ? ? ?Felicia Noa, PhD ? ? ? ? ? ? ? ? ? ? ? ? ? ? ?Felicia Noa, PhD ? ? ? ? ? ? ? ? ? ? ? ? ? ? ?Felicia Noa, PhD ? ? ? ? ? ? ? ? ? ? ? ? ? ? ?Felicia Noa, PhD ? ? ? ? ? ? ? ? ? ? ? ? ? ? ?Felicia Noa,  PhD ? ? ? ? ? ? ? ? ? ? ? ? ? ?

## 2021-11-06 ENCOUNTER — Ambulatory Visit (INDEPENDENT_AMBULATORY_CARE_PROVIDER_SITE_OTHER): Payer: BC Managed Care – PPO | Admitting: Clinical

## 2021-11-06 DIAGNOSIS — F840 Autistic disorder: Secondary | ICD-10-CM | POA: Diagnosis not present

## 2021-11-06 NOTE — Progress Notes (Signed)
Diagnosis: F84.0, F33.2 Time: 11:01 am-11:51am CPT: [id number]  Felicia [name] was seen remotely using secure video conferencing due to the COVID19 pandemic. She was in her home in [place] and the therapist was in her office at the time of the appointment. Session focused on issues that had arisen in her relationships since her last session. She had overheard a conversation wherein a friend had explicitly asked another friend not to invite her to their home. Session focused on processing Felicia [name]'s feelings about this incident, considering her options, and considering alternative interpretations. She is scheduled to be seen again in two weeks. Objectives Related Problem: Felicia [name] experiences symptoms of depression related to difficulty connecting socially with others Description: Felicia [name] will increase her sense of well-being and connectedness and decrease symptoms of depression Target Date: 2022-05-03 Frequency: Biweekly Modality: individual Progress: 0% Planned Intervention: Therapist will provide referrals for additional resources as appropriate  Planned Intervention: Therapist will engage Felicia [name] in behavioral activation, including the incorporation of structure, pleasant events, and mastery activities into her routine  Planned Intervention: Therapist will help Felicia [name] to identify and disengage from maladaptive thought and behavior patterns using CBT-based strategies  Planned Intervention: Therapist will provide Felicia [name] with opportunities to process her experiences in session  Planned Intervention: Therapist will provide Felicia [name] with strategies to regulate her emotions, including self-care, mindfulness, meditation, and relaxation exercises  Treatment Plan Client Abilities/Strengths  Felicia [name] presents as motivated to improve her situation.  Client Treatment Preferences  None noted.  Client Statement of Needs  Felicia [name] is seeking CBT to manage symptoms of depression related to difficulty in social interactions.   Treatment Level  Biweekly  Symptoms  Autism: difficulty perceiving and responding to social cues, engaging in reciprocal conversation (Status: maintained). Depression: sleep irregularity, depressed mood, tearfulness, poor self confidence, feelings of loneliness (Status: maintained).  Problems Addressed  New Description  Goals 1. Keilyn experiences symptoms of depression related to difficulty connecting socially with others Objective Felicia [name] will increase her sense of well-being and connectedness and decrease symptoms of depression Target Date: 2022-05-03 Frequency: Biweekly  Progress: 0 Modality: individual  Related Interventions Therapist will provide referrals for additional resources as appropriate Therapist will work with Felicia [name] to develop her social skills incorporating role-play exercises and video modeling, such as through the [employer] role play videos Therapist will engage Felicia [name] in behavioral activation, including the incorporation of structure, pleasant events, and mastery activities into her routine Therapist will provide Felicia [name] with strategies to regulate her emotions, including self-care, mindfulness, meditation, and relaxation exercises Therapist will help Felicia [name] to identify and disengage from maladaptive thought patterns using CBT-based strategies Therapist will provide Felicia [name] with opportunities to process her experiences in session Diagnosis Axis none 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

## 2021-11-18 DIAGNOSIS — F331 Major depressive disorder, recurrent, moderate: Secondary | ICD-10-CM | POA: Diagnosis not present

## 2021-11-18 DIAGNOSIS — F909 Attention-deficit hyperactivity disorder, unspecified type: Secondary | ICD-10-CM | POA: Diagnosis not present

## 2021-11-18 DIAGNOSIS — J4520 Mild intermittent asthma, uncomplicated: Secondary | ICD-10-CM | POA: Diagnosis not present

## 2021-11-18 DIAGNOSIS — J309 Allergic rhinitis, unspecified: Secondary | ICD-10-CM | POA: Diagnosis not present

## 2021-11-20 ENCOUNTER — Ambulatory Visit (INDEPENDENT_AMBULATORY_CARE_PROVIDER_SITE_OTHER): Payer: BC Managed Care – PPO | Admitting: Clinical

## 2021-11-20 DIAGNOSIS — F840 Autistic disorder: Secondary | ICD-10-CM

## 2021-11-20 NOTE — Progress Notes (Signed)
Diagnosis: F84.0, F33.2 Time: 11:01 am-11:51am CPT: [id number]  Felicia [name] was seen remotely using secure video conferencing due to the [place] pandemic. She was in her home in [place] and the therapist was in her office at the time of the appointment. Her mother joined the beginning of session and voiced frustration contacting Felicia [name]'s psychiatrist. Therapist provided several referral options, which Ms Boch requested be shared via secure message. Therapist then met with Felicia [name] individually. She discussed her difficulty regulating her emotions and tendency toward quick anger in the past few months, which have contributed to issues in her relationship. Therapist suggested coming up with a code word with her boyfriend that she can use when she feels herself starting to have an outburst, as a way to quickly communicate she needs a break from the conversation. Therapist suggested that the boyfriend also use the code word to request to pause conversations wherein he is feeling offended by Felicia [name]'s tone, with a plan to return at a later time when both are calm. She indicated plan to try this, and is scheduled to be seen again in two weeks.    Objectives Related Problem: Felicia [name] experiences symptoms of depression related to difficulty connecting socially with others Description: Felicia [name] will increase her sense of well-being and connectedness and decrease symptoms of depression Target Date: 2022-05-03 Frequency: Biweekly Modality: individual Progress: 0% Planned Intervention: Therapist will provide referrals for additional resources as appropriate  Planned Intervention: Therapist will engage Felicia [name] in behavioral activation, including the incorporation of structure, pleasant events, and mastery activities into her routine  Planned Intervention: Therapist will help Felicia [name] to identify and disengage from maladaptive thought and behavior patterns using CBT-based strategies  Planned Intervention: Therapist will  provide Felicia [name] with opportunities to process her experiences in session  Planned Intervention: Therapist will provide Felicia [name] with strategies to regulate her emotions, including self-care, mindfulness, meditation, and relaxation exercises  Treatment Plan Client Abilities/Strengths  Felicia [name] presents as motivated to improve her situation.  Client Treatment Preferences  None noted.  Client Statement of Needs  Felicia [name] is seeking CBT to manage symptoms of depression related to difficulty in social interactions.  Treatment Level  Biweekly  Symptoms  Autism: difficulty perceiving and responding to social cues, engaging in reciprocal conversation (Status: maintained). Depression: sleep irregularity, depressed mood, tearfulness, poor self confidence, feelings of loneliness (Status: maintained).  Problems Addressed  New Description  Goals 1. Felicia [name] experiences symptoms of depression related to difficulty connecting socially with others Objective Felicia [name] will increase her sense of well-being and connectedness and decrease symptoms of depression Target Date: 2022-05-03 Frequency: Biweekly  Progress: 0 Modality: individual  Related Interventions Therapist will provide referrals for additional resources as appropriate Therapist will work with Felicia [name] to develop her social skills incorporating role-play exercises and video modeling, such as through the [employer] role play videos Therapist will engage Alzena in behavioral activation, including the incorporation of structure, pleasant events, and mastery activities into her routine Therapist will provide Felicia [name] with strategies to regulate her emotions, including self-care, mindfulness, meditation, and relaxation exercises Therapist will help Tahiri to identify and disengage from maladaptive thought patterns using CBT-based strategies Therapist will provide Salayah with opportunities to process her experiences in session Diagnosis Axis none 296.31 (Major depressive  affective disorder, recurrent episode, mild) - Open - [Signifier: n/a]    Axis none 299.00 (Autistic disorder, current or active state) - Open - [Signifier: n/a]    Conditions For Discharge Achievement of treatment goals and objectives      Diesha Rostad L  Gaynell Face, PhD

## 2021-12-04 ENCOUNTER — Ambulatory Visit (INDEPENDENT_AMBULATORY_CARE_PROVIDER_SITE_OTHER): Payer: BC Managed Care – PPO | Admitting: Clinical

## 2021-12-04 DIAGNOSIS — F840 Autistic disorder: Secondary | ICD-10-CM | POA: Diagnosis not present

## 2021-12-04 DIAGNOSIS — F331 Major depressive disorder, recurrent, moderate: Secondary | ICD-10-CM

## 2021-12-04 NOTE — Progress Notes (Signed)
Diagnosis: F84.0, F33.2 Time: 11:01 am-11:51am CPT: [id number]  Felicia [name] was seen remotely using secure video conferencing due to the [place] pandemic. She was in her home in [place] and the therapist was in her office at the time of the appointment. Her mother joined the beginning of session and reported that Felicia [name] had been scheduled with Dr. Emelda Brothers at [facility].  Therapist then met with Felicia [name], who reported that she has been feeling good in recent weeks but has experience an urge to self-isolate due to relational stressors. Therapist suggested challenging herself to initiate one get-together with friends each week, centered on an activity she enjoys, with a clear start and end time. She indicated a plan to do this, and is scheduled to be seen again in two weeks.   Objectives Related Problem: Felicia [name] experiences symptoms of depression related to difficulty connecting socially with others Description: Felicia [name] will increase her sense of well-being and connectedness and decrease symptoms of depression Target Date: 2022-05-03 Frequency: Biweekly Modality: individual Progress: 0% Planned Intervention: Therapist will provide referrals for additional resources as appropriate  Planned Intervention: Therapist will engage Felicia [name] in behavioral activation, including the incorporation of structure, pleasant events, and mastery activities into her routine  Planned Intervention: Therapist will help Felicia [name] to identify and disengage from maladaptive thought and behavior patterns using CBT-based strategies  Planned Intervention: Therapist will provide Felicia [name] with opportunities to process her experiences in session  Planned Intervention: Therapist will provide Felicia [name] with strategies to regulate her emotions, including self-care, mindfulness, meditation, and relaxation exercises  Treatment Plan Client Abilities/Strengths  Felicia [name] presents as motivated to improve her situation.  Client Treatment  Preferences  None noted.  Client Statement of Needs  Felicia [name] is seeking CBT to manage symptoms of depression related to difficulty in social interactions.  Treatment Level  Biweekly  Symptoms  Autism: difficulty perceiving and responding to social cues, engaging in reciprocal conversation (Status: maintained). Depression: sleep irregularity, depressed mood, tearfulness, poor self confidence, feelings of loneliness (Status: maintained).  Problems Addressed  New Description  Goals 1. Felicia [name] experiences symptoms of depression related to difficulty connecting socially with others Objective Felicia [name] will increase her sense of well-being and connectedness and decrease symptoms of depression Target Date: 2022-05-03 Frequency: Biweekly  Progress: 0 Modality: individual  Related Interventions Therapist will provide referrals for additional resources as appropriate Therapist will work with Felicia [name] to develop her social skills incorporating role-play exercises and video modeling, such as through the [employer] role play videos Therapist will engage Felicia [name] in behavioral activation, including the incorporation of structure, pleasant events, and mastery activities into her routine Therapist will provide Felicia [name] with strategies to regulate her emotions, including self-care, mindfulness, meditation, and relaxation exercises Therapist will help Felicia [name] to identify and disengage from maladaptive thought patterns using CBT-based strategies Therapist will provide Felicia [name] with opportunities to process her experiences in session Diagnosis Axis none 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

## 2021-12-18 ENCOUNTER — Ambulatory Visit (INDEPENDENT_AMBULATORY_CARE_PROVIDER_SITE_OTHER): Payer: BC Managed Care – PPO | Admitting: Clinical

## 2021-12-18 DIAGNOSIS — F840 Autistic disorder: Secondary | ICD-10-CM | POA: Diagnosis not present

## 2021-12-18 NOTE — Progress Notes (Signed)
Diagnosis: F84.0, F33.2 Time: 11:01 am-11:51am CPT: [id number]-09  Felicia [name] was seen remotely using secure video conferencing due to the COVID19 pandemic. She was in her home in [place] and the therapist was in her office at the time of the appointment. Felicia [name] reported significant difficulty breathing due to poor air quality, which has created stress. Session focused on several events that had taken place in Felicia [name]'s relationships, and therapist encouraged her to consider healthy boundary setting for herself, as well as observing other's boundaries. She is scheduled to be seen again in two weeks.   Objectives Related Problem: [name] experiences symptoms of depression related to difficulty connecting socially with others Description: Felicia [name] will increase her sense of well-being and connectedness and decrease symptoms of depression Target Date: 2022-05-03 Frequency: Biweekly Modality: individual Progress: 0% Planned Intervention: Therapist will provide referrals for additional resources as appropriate  Planned Intervention: Therapist will engage Felicia [name] in behavioral activation, including the incorporation of structure, pleasant events, and mastery activities into her routine  Planned Intervention: Therapist will help Felicia [name] to identify and disengage from maladaptive thought and behavior patterns using CBT-based strategies  Planned Intervention: Therapist will provide Felicia [name] with opportunities to process her experiences in session  Planned Intervention: Therapist will provide Felicia [name] with strategies to regulate her emotions, including self-care, mindfulness, meditation, and relaxation exercises  Treatment Plan Client Abilities/Strengths  Felicia [name] presents as motivated to improve her situation.  Client Treatment Preferences  None noted.  Client Statement of Needs  Felicia [name] is seeking CBT to manage symptoms of depression related to difficulty in social interactions.  Treatment Level  Biweekly  Symptoms   Autism: difficulty perceiving and responding to social cues, engaging in reciprocal conversation (Status: maintained). Depression: sleep irregularity, depressed mood, tearfulness, poor self confidence, feelings of loneliness (Status: maintained).  Problems Addressed  New Description  Goals 1. Felicia [name] experiences symptoms of depression related to difficulty connecting socially with others Objective Felicia [name] will increase her sense of well-being and connectedness and decrease symptoms of depression Target Date: 2022-05-03 Frequency: Biweekly  Progress: 0 Modality: individual  Related Interventions Therapist will provide referrals for additional resources as appropriate Therapist will work with Felicia [name] to develop her social skills incorporating role-play exercises and video modeling, such as through the [employer] role play videos Therapist will engage Felicia [name] in behavioral activation, including the incorporation of structure, pleasant events, and mastery activities into her routine Therapist will provide Felicia [name] with strategies to regulate her emotions, including self-care, mindfulness, meditation, and relaxation exercises Therapist will help Felicia [name] to identify and disengage from maladaptive thought patterns using CBT-based strategies Therapist will provide Felicia [name] with opportunities to process her experiences in session Diagnosis Axis none 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

## 2021-12-23 DIAGNOSIS — F70 Mild intellectual disabilities: Secondary | ICD-10-CM | POA: Diagnosis not present

## 2021-12-23 DIAGNOSIS — F840 Autistic disorder: Secondary | ICD-10-CM | POA: Diagnosis not present

## 2021-12-23 DIAGNOSIS — F3489 Other specified persistent mood disorders: Secondary | ICD-10-CM | POA: Diagnosis not present

## 2021-12-23 DIAGNOSIS — F900 Attention-deficit hyperactivity disorder, predominantly inattentive type: Secondary | ICD-10-CM | POA: Diagnosis not present

## 2021-12-23 DIAGNOSIS — Z79899 Other long term (current) drug therapy: Secondary | ICD-10-CM | POA: Diagnosis not present

## 2022-01-01 ENCOUNTER — Ambulatory Visit (INDEPENDENT_AMBULATORY_CARE_PROVIDER_SITE_OTHER): Payer: BC Managed Care – PPO | Admitting: Clinical

## 2022-01-01 DIAGNOSIS — F840 Autistic disorder: Secondary | ICD-10-CM | POA: Diagnosis not present

## 2022-01-01 NOTE — Progress Notes (Signed)
Diagnosis: F84.0, F33.2 Time: 11:01 am-11:51am CPT: 23762G-31  Felicia [name] [name] was seen remotely using secure video conferencing due to Felicia [name] Felicia [name] [name]. She was in her home in [place] and Felicia [name] therapist was in her office at Felicia [name] time of Felicia [name] appointment. Felicia [name] [name] shared that a close friend had cut off contact with her after she canceled plans t spend time with her boyfriend. Session focused on processing this, as well as her reported tendency to blow up at friends "out of nowhere." For homework, she will track these meltdowns, writing down: 1) what happened right before? 2) what she did during Felicia [name] meltown? 3) How she feels (tired, hungry, etc.) 4) How did she eventually calm down? She is scheduled to be seen again in two weeks.   Objectives Related Problem: Felicia [name] [name] experiences symptoms of depression related to difficulty connecting socially with others Description: Felicia [name] [name] will increase her sense of well-being and connectedness and decrease symptoms of depression Target Date: 2022-05-03 Frequency: Biweekly Modality: individual Progress: 0% Planned Intervention: Therapist will provide referrals for additional resources as appropriate  Planned Intervention: Therapist will engage Felicia [name] [name] in behavioral activation, including Felicia [name] incorporation of structure, pleasant events, and mastery activities into her routine  Planned Intervention: Therapist will help Felicia [name] [name] to identify and disengage from maladaptive thought and behavior patterns using CBT-based strategies  Planned Intervention: Therapist will provide Felicia [name] [name] with opportunities to process her experiences in session  Planned Intervention: Therapist will provide Felicia [name] [name] with strategies to regulate her emotions, including self-care, mindfulness, meditation, and relaxation exercises  Treatment Plan Client Abilities/Strengths  Felicia [name] [name] presents as motivated to improve her situation.  Client Treatment Preferences  None noted.  Client Statement of Needs  Felicia [name] [name] is  seeking CBT to manage symptoms of depression related to difficulty in social interactions.  Treatment Level  Biweekly  Symptoms  Autism: difficulty perceiving and responding to social cues, engaging in reciprocal conversation (Status: maintained). Depression: sleep irregularity, depressed mood, tearfulness, poor self confidence, feelings of loneliness (Status: maintained).  Problems Addressed  New Description  Goals 1. Felicia [name] [name] experiences symptoms of depression related to difficulty connecting socially with others Objective Felicia [name] [name] will increase her sense of well-being and connectedness and decrease symptoms of depression Target Date: 2022-05-03 Frequency: Biweekly  Progress: 0 Modality: individual  Related Interventions Therapist will provide referrals for additional resources as appropriate Therapist will work with Felicia [name] [name] to develop her social skills incorporating role-play exercises and video modeling, such as through Felicia [name] Felicia [name] [name] role play videos Therapist will engage Felicia [name] in behavioral activation, including Felicia [name] incorporation of structure, pleasant events, and mastery activities into her routine Therapist will provide Felicia [name] [name] with strategies to regulate her emotions, including self-care, mindfulness, meditation, and relaxation exercises Therapist will help Felicia [name] to identify and disengage from maladaptive thought patterns using CBT-based strategies Therapist will provide Felicia [name] with opportunities to process her experiences in session Diagnosis Axis none 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

## 2022-01-15 ENCOUNTER — Ambulatory Visit (INDEPENDENT_AMBULATORY_CARE_PROVIDER_SITE_OTHER): Payer: BC Managed Care – PPO | Admitting: Clinical

## 2022-01-15 DIAGNOSIS — F840 Autistic disorder: Secondary | ICD-10-CM

## 2022-01-15 NOTE — Progress Notes (Signed)
Diagnosis: F84.0, F33.2 Time: 11:08 am-11:51am CPT: [id number]-14  Vivianne was seen remotely using secure video conferencing due to the COVID19 pandemic. She was in her home in [place] and the therapist was in her office at the time of the appointment. She shared that she and her friend had reconciled, but expressed frustration that friends do not show her the same forgiveness she shows them. Therapist engaged her in a review of mood tracking she had completed as homework to better understand anger triggers. She is scheduled to be seen again in one month, and for homework will continue tracking her moods, as well as take time to reflect upon whether she would like to reach out to a friend with whom she had a falling out over differing political beliefs.   Objectives Related Problem: Charidy experiences symptoms of depression related to difficulty connecting socially with others Description: Glora will increase her sense of well-being and connectedness and decrease symptoms of depression Target Date: 2022-05-03 Frequency: Biweekly Modality: individual Progress: 0% Planned Intervention: Therapist will provide referrals for additional resources as appropriate  Planned Intervention: Therapist will engage Aniah in behavioral activation, including the incorporation of structure, pleasant events, and mastery activities into her routine  Planned Intervention: Therapist will help Dulcemaria to identify and disengage from maladaptive thought and behavior patterns using CBT-based strategies  Planned Intervention: Therapist will provide Jadin with opportunities to process her experiences in session  Planned Intervention: Therapist will provide Shawny with strategies to regulate her emotions, including self-care, mindfulness, meditation, and relaxation exercises  Treatment Plan Client Abilities/Strengths  Starlene presents as motivated to improve her situation.  Client Treatment Preferences  None noted.  Client  Statement of Needs  Dasia is seeking CBT to manage symptoms of depression related to difficulty in social interactions.  Treatment Level  Biweekly  Symptoms  Autism: difficulty perceiving and responding to social cues, engaging in reciprocal conversation (Status: maintained). Depression: sleep irregularity, depressed mood, tearfulness, poor self confidence, feelings of loneliness (Status: maintained).  Problems Addressed  New Description  Goals 1. Jessicia experiences symptoms of depression related to difficulty connecting socially with others Objective Kaicee will increase her sense of well-being and connectedness and decrease symptoms of depression Target Date: 2022-05-03 Frequency: Biweekly  Progress: 0 Modality: individual  Related Interventions Therapist will provide referrals for additional resources as appropriate Therapist will work with Dorla to develop her social skills incorporating role-play exercises and video modeling, such as through the [employer] role play videos Therapist will engage Aveya in behavioral activation, including the incorporation of structure, pleasant events, and mastery activities into her routine Therapist will provide Marivel with strategies to regulate her emotions, including self-care, mindfulness, meditation, and relaxation exercises Therapist will help Etsuko to identify and disengage from maladaptive thought patterns using CBT-based strategies Therapist will provide Jema with opportunities to process her experiences in session Diagnosis Axis none 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

## 2022-01-27 DIAGNOSIS — F70 Mild intellectual disabilities: Secondary | ICD-10-CM | POA: Diagnosis not present

## 2022-01-27 DIAGNOSIS — F840 Autistic disorder: Secondary | ICD-10-CM | POA: Diagnosis not present

## 2022-01-27 DIAGNOSIS — F900 Attention-deficit hyperactivity disorder, predominantly inattentive type: Secondary | ICD-10-CM | POA: Diagnosis not present

## 2022-01-27 DIAGNOSIS — F3489 Other specified persistent mood disorders: Secondary | ICD-10-CM | POA: Diagnosis not present

## 2022-01-29 ENCOUNTER — Ambulatory Visit: Payer: BC Managed Care – PPO | Admitting: Clinical

## 2022-02-12 ENCOUNTER — Ambulatory Visit (INDEPENDENT_AMBULATORY_CARE_PROVIDER_SITE_OTHER): Payer: BC Managed Care – PPO | Admitting: Clinical

## 2022-02-12 DIAGNOSIS — F331 Major depressive disorder, recurrent, moderate: Secondary | ICD-10-CM

## 2022-02-12 DIAGNOSIS — F840 Autistic disorder: Secondary | ICD-10-CM | POA: Diagnosis not present

## 2022-02-12 NOTE — Progress Notes (Signed)
Diagnosis: F84.0, F33.2 Time: 11:08 am-11:51am CPT: [id number]  Felicia [name] was seen remotely using secure video conferencing due to the COVID19 pandemic. She was in her home in [place] and the therapist was in her office at the time of the appointment. Her mother joined the start of session and shared that Felicia [name] had started seeing Dr. Tamela Oddi for medication management, and had been prescribed adderall (10mg  adderall ER in morning, 10mg  IR in afternoon). She also begun an intake process with disability services, with testing scheduled in the fall. Felicia [name] shared that her relationship had ended and she was in the first few weeks of a new relationship, and session focused on processing these events, with therapist providing validation and support as well as suggesting communication strategies. She shared feeling exhausted and irritable at times, and queried whether this may relate to her new adderall prescription. Therapist suggested she track her moods and the timing of her dosage for homework. She is scheduled to be seen again in two weeks.    Objectives Related Problem: Felicia [name] experiences symptoms of depression related to difficulty connecting socially with others Description: Felicia [name] will increase her sense of well-being and connectedness and decrease symptoms of depression Target Date: 2022-05-03 Frequency: Biweekly Modality: individual Progress: 0% Planned Intervention: Therapist will provide referrals for additional resources as appropriate  Planned Intervention: Therapist will engage Felicia [name] in behavioral activation, including the incorporation of structure, pleasant events, and mastery activities into her routine  Planned Intervention: Therapist will help Felicia [name] to identify and disengage from maladaptive thought and behavior patterns using CBT-based strategies  Planned Intervention: Therapist will provide Felicia [name] with opportunities to process her experiences in session  Planned Intervention:  Therapist will provide Felicia [name] with strategies to regulate her emotions, including self-care, mindfulness, meditation, and relaxation exercises  Treatment Plan Client Abilities/Strengths  Felicia [name] presents as motivated to improve her situation.  Client Treatment Preferences  None noted.  Client Statement of Needs  Felicia [name] is seeking CBT to manage symptoms of depression related to difficulty in social interactions.  Treatment Level  Biweekly  Symptoms  Autism: difficulty perceiving and responding to social cues, engaging in reciprocal conversation (Status: maintained). Depression: sleep irregularity, depressed mood, tearfulness, poor self confidence, feelings of loneliness (Status: maintained).  Problems Addressed  New Description  Goals 1. Majesti experiences symptoms of depression related to difficulty connecting socially with others Objective Felicia [name] will increase her sense of well-being and connectedness and decrease symptoms of depression Target Date: 2022-05-03 Frequency: Biweekly  Progress: 0 Modality: individual  Related Interventions Therapist will provide referrals for additional resources as appropriate Therapist will work with Amiayah to develop her social skills incorporating role-play exercises and video modeling, such as through the 2022-05-05 role play videos Therapist will engage Skya in behavioral activation, including the incorporation of structure, pleasant events, and mastery activities into her routine Therapist will provide Adelene with strategies to regulate her emotions, including self-care, mindfulness, meditation, and relaxation exercises Therapist will help Philis to identify and disengage from maladaptive thought patterns using CBT-based strategies Therapist will provide Marice with opportunities to process her experiences in session Diagnosis Axis none 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 

## 2022-02-26 ENCOUNTER — Ambulatory Visit (INDEPENDENT_AMBULATORY_CARE_PROVIDER_SITE_OTHER): Payer: BC Managed Care – PPO | Admitting: Clinical

## 2022-02-26 DIAGNOSIS — F840 Autistic disorder: Secondary | ICD-10-CM | POA: Diagnosis not present
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
## 2022-03-12 ENCOUNTER — Ambulatory Visit (INDEPENDENT_AMBULATORY_CARE_PROVIDER_SITE_OTHER): Payer: BC Managed Care – PPO | Admitting: Clinical

## 2022-03-12 DIAGNOSIS — F840 Autistic disorder: Secondary | ICD-10-CM

## 2022-03-12 NOTE — Progress Notes (Signed)
Diagnosis: F84.0, F33.2 Time: 11:01 am-11:57 am CPT: [id number]  Felicia [name] was seen remotely using secure video conferencing due to the Felicia [name]. She was in her home in [place] and the therapist was in her home at the time of the appointment. Session focused on continuing to reflect upon her patterns in relationships. Therapist pointed out a difficulty with upholding boundaries, and encouraged [name] to practice noticing how relationships make her feel and using this as a basis for decisions as to whether to continue in the relationship. She is scheduled to be seen again in two weeks    Objectives Related Problem: [name] experiences symptoms of depression related to difficulty connecting socially with others Description: [name] will increase her sense of well-being and connectedness and decrease symptoms of depression Target Date: 2022-05-03 Frequency: Biweekly Modality: individual Progress: 0% Planned Intervention: Therapist will provide referrals for additional resources as appropriate  Planned Intervention: Therapist will engage [name] in behavioral activation, including the incorporation of structure, pleasant events, and mastery activities into her routine  Planned Intervention: Therapist will help [name] to identify and disengage from maladaptive thought and behavior patterns using CBT-based strategies  Planned Intervention: Therapist will provide [name] with opportunities to process her experiences in session  Planned Intervention: Therapist will provide [name] with strategies to regulate her emotions, including self-care, mindfulness, meditation, and relaxation exercises  Treatment Plan Client Abilities/Strengths  [name] presents as motivated to improve her situation.  Client Treatment Preferences  None noted.  Client Statement of Needs  [name] is seeking CBT to manage symptoms of depression related to difficulty in social interactions.  Treatment Level  Biweekly  Symptoms   Autism: difficulty perceiving and responding to social cues, engaging in reciprocal conversation (Status: maintained). Depression: sleep irregularity, depressed mood, tearfulness, poor self confidence, feelings of loneliness (Status: maintained).  Problems Addressed  New Description  Goals 1. [name] experiences symptoms of depression related to difficulty connecting socially with others Objective [name] will increase her sense of well-being and connectedness and decrease symptoms of depression Target Date: 2022-05-03 Frequency: Biweekly  Progress: 0 Modality: individual  Related Interventions Therapist will provide referrals for additional resources as appropriate Therapist will work with [name] to develop her social skills incorporating role-play exercises and video modeling, such as through the [employer] role play videos Therapist will engage [name] in behavioral activation, including the incorporation of structure, pleasant events, and mastery activities into her routine Therapist will provide [name] with strategies to regulate her emotions, including self-care, mindfulness, meditation, and relaxation exercises Therapist will help [name] to identify and disengage from maladaptive thought patterns using CBT-based strategies Therapist will provide [name] with opportunities to process her experiences in session Diagnosis Axis none 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

## 2022-03-26 ENCOUNTER — Ambulatory Visit (INDEPENDENT_AMBULATORY_CARE_PROVIDER_SITE_OTHER): Payer: BC Managed Care – PPO | Admitting: Clinical

## 2022-03-26 DIAGNOSIS — F331 Major depressive disorder, recurrent, moderate: Secondary | ICD-10-CM | POA: Diagnosis not present

## 2022-03-26 DIAGNOSIS — F840 Autistic disorder: Secondary | ICD-10-CM

## 2022-03-26 NOTE — Progress Notes (Signed)
Diagnosis: F84.0, F33.2 Time: 11:01 am-11:57 am CPT: [id number]  Naysha was seen remotely using secure video conferencing due to the [place] pandemic. She was in her home in [place] and the therapist was in her office at the time of the appointment. She reported feeling good overall in recent weeks, and session focused on processing recent events in her relationships in order to consider next steps. She is scheduled to be seen again in two weeks.  Objectives Related Problem: Felicia [name] experiences symptoms of depression related to difficulty connecting socially with others Description: Karmina will increase her sense of well-being and connectedness and decrease symptoms of depression Target Date: 2022-05-03 Frequency: Biweekly Modality: individual Progress: 0% Planned Intervention: Therapist will provide referrals for additional resources as appropriate  Planned Intervention: Therapist will engage Adabella in behavioral activation, including the incorporation of structure, pleasant events, and mastery activities into her routine  Planned Intervention: Therapist will help Lani to identify and disengage from maladaptive thought and behavior patterns using CBT-based strategies  Planned Intervention: Therapist will provide Leina with opportunities to process her experiences in session  Planned Intervention: Therapist will provide Tomeeka with strategies to regulate her emotions, including self-care, mindfulness, meditation, and relaxation exercises  Treatment Plan Client Abilities/Strengths  Bill presents as motivated to improve her situation.  Client Treatment Preferences  None noted.  Client Statement of Needs  Nalah is seeking CBT to manage symptoms of depression related to difficulty in social interactions.  Treatment Level  Biweekly  Symptoms  Autism: difficulty perceiving and responding to social cues, engaging in reciprocal conversation (Status: maintained). Depression: sleep irregularity,  depressed mood, tearfulness, poor self confidence, feelings of loneliness (Status: maintained).  Problems Addressed  New Description  Goals 1. Mirranda experiences symptoms of depression related to difficulty connecting socially with others Objective Rilea will increase her sense of well-being and connectedness and decrease symptoms of depression Target Date: 2022-05-03 Frequency: Biweekly  Progress: 0 Modality: individual  Related Interventions Therapist will provide referrals for additional resources as appropriate Therapist will work with Talene to develop her social skills incorporating role-play exercises and video modeling, such as through the [employer] role play videos Therapist will engage Raetta in behavioral activation, including the incorporation of structure, pleasant events, and mastery activities into her routine Therapist will provide Dinah with strategies to regulate her emotions, including self-care, mindfulness, meditation, and relaxation exercises Therapist will help Noelia to identify and disengage from maladaptive thought patterns using CBT-based strategies Therapist will provide Insiya with opportunities to process her experiences in session Diagnosis Axis none 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

## 2022-04-09 ENCOUNTER — Ambulatory Visit (INDEPENDENT_AMBULATORY_CARE_PROVIDER_SITE_OTHER): Payer: BC Managed Care – PPO | Admitting: Clinical

## 2022-04-09 DIAGNOSIS — F840 Autistic disorder: Secondary | ICD-10-CM | POA: Diagnosis not present

## 2022-04-09 DIAGNOSIS — F331 Major depressive disorder, recurrent, moderate: Secondary | ICD-10-CM

## 2022-04-09 NOTE — Progress Notes (Signed)
Diagnosis: F84.0, F33.2 Time: 11:01 am-11:57 am CPT: 90240X-73  Felicia [name] was seen remotely using secure video conferencing due to the [place] pandemic. She was in her home in [place] and the therapist was in her office at the time of the appointment. She shared that she has been doing well overall, and has noticed fewer mood swings. Session focused on navigating dynamics in her relationships. She is scheduled to be seen again in two weeks.  Objectives Related Problem: [name] experiences symptoms of depression related to difficulty connecting socially with others Description: [name] will increase her sense of well-being and connectedness and decrease symptoms of depression Target Date: 2022-05-03 Frequency: Biweekly Modality: individual Progress: 0% Planned Intervention: Therapist will provide referrals for additional resources as appropriate  Planned Intervention: Therapist will engage [name] in behavioral activation, including the incorporation of structure, pleasant events, and mastery activities into her routine  Planned Intervention: Therapist will help [name] to identify and disengage from maladaptive thought and behavior patterns using CBT-based strategies  Planned Intervention: Therapist will provide [name] with opportunities to process her experiences in session  Planned Intervention: Therapist will provide [name] with strategies to regulate her emotions, including self-care, mindfulness, meditation, and relaxation exercises  Treatment Plan Client Abilities/Strengths  [name] presents as motivated to improve her situation.  Client Treatment Preferences  None noted.  Client Statement of Needs  [name] is seeking CBT to manage symptoms of depression related to difficulty in social interactions.  Treatment Level  Biweekly  Symptoms  Autism: difficulty perceiving and responding to social cues, engaging in reciprocal conversation (Status: maintained). Depression: sleep irregularity,  depressed mood, tearfulness, poor self confidence, feelings of loneliness (Status: maintained).  Problems Addressed  New Description  Goals 1. [name] experiences symptoms of depression related to difficulty connecting socially with others Objective [name] will increase her sense of well-being and connectedness and decrease symptoms of depression Target Date: 2022-05-03 Frequency: Biweekly  Progress: 0 Modality: individual  Related Interventions Therapist will provide referrals for additional resources as appropriate Therapist will work with [name] to develop her social skills incorporating role-play exercises and video modeling, such as through the [employer] role play videos Therapist will engage [name] in behavioral activation, including the incorporation of structure, pleasant events, and mastery activities into her routine Therapist will provide [name] with strategies to regulate her emotions, including self-care, mindfulness, meditation, and relaxation exercises Therapist will help [name] to identify and disengage from maladaptive thought patterns using CBT-based strategies Therapist will provide [name] with opportunities to process her experiences in session Diagnosis Axis none 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

## 2022-04-17 DIAGNOSIS — F900 Attention-deficit hyperactivity disorder, predominantly inattentive type: Secondary | ICD-10-CM | POA: Diagnosis not present

## 2022-04-17 DIAGNOSIS — F3489 Other specified persistent mood disorders: Secondary | ICD-10-CM | POA: Diagnosis not present

## 2022-04-17 DIAGNOSIS — F840 Autistic disorder: Secondary | ICD-10-CM | POA: Diagnosis not present

## 2022-04-17 DIAGNOSIS — F70 Mild intellectual disabilities: Secondary | ICD-10-CM | POA: Diagnosis not present

## 2022-04-23 ENCOUNTER — Ambulatory Visit (INDEPENDENT_AMBULATORY_CARE_PROVIDER_SITE_OTHER): Payer: BC Managed Care – PPO | Admitting: Clinical

## 2022-04-23 DIAGNOSIS — F331 Major depressive disorder, recurrent, moderate: Secondary | ICD-10-CM

## 2022-04-23 DIAGNOSIS — F840 Autistic disorder: Secondary | ICD-10-CM | POA: Diagnosis not present
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
## 2022-05-07 ENCOUNTER — Ambulatory Visit: Payer: BC Managed Care – PPO | Admitting: Clinical

## 2022-05-21 ENCOUNTER — Ambulatory Visit (INDEPENDENT_AMBULATORY_CARE_PROVIDER_SITE_OTHER): Payer: BC Managed Care – PPO | Admitting: Clinical

## 2022-05-21 DIAGNOSIS — F840 Autistic disorder: Secondary | ICD-10-CM | POA: Diagnosis not present

## 2022-05-21 NOTE — Progress Notes (Signed)
Diagnosis: F84.0, F33.2 Time: 11:01 am-11:57 am CPT: [id number]  Felicia [name] was seen remotely using secure video conferencing due to the COVID19 pandemic. She was in her home in [place] and the therapist was in her office at the time of the appointment. She reported a largely stable mood since her last session. She expressed concern about changes in her work schedule, and reported that she has not spoken with the friend with whom she had ended contact prior to Thanksgiving. Session focused on conflicts that had arisen in her relationship. Therapist pointed out cognitive distortions (mind reading, catastrophizing) and suggested communication strategies. She is scheduled to be seen again in one month.  Objectives Related Problem: Felicia [name] experiences symptoms of depression related to difficulty connecting socially with others Description: Felicia [name] will increase her sense of well-being and connectedness and decrease symptoms of depression Target Date: 2023-05-04 Frequency: Biweekly Modality: individual Progress: 0% Planned Intervention: Therapist will provide referrals for additional resources as appropriate  Planned Intervention: Therapist will engage Felicia [name] in behavioral activation, including the incorporation of structure, pleasant events, and mastery activities into her routine  Planned Intervention: Therapist will help Felicia [name] to identify and disengage from maladaptive thought and behavior patterns using CBT-based strategies  Planned Intervention: Therapist will provide Felicia [name] with opportunities to process her experiences in session  Planned Intervention: Therapist will provide Felicia [name] with strategies to regulate her emotions, including self-care, mindfulness, meditation, and relaxation exercises  Treatment Plan Client Abilities/Strengths  Felicia [name] presents as motivated to improve her situation.  Client Treatment Preferences  None noted.  Client Statement of Needs  Felicia [name] is seeking CBT to manage symptoms  of depression related to difficulty in social interactions.  Treatment Level  Biweekly  Symptoms  Autism: difficulty perceiving and responding to social cues, engaging in reciprocal conversation (Status: maintained). Depression: sleep irregularity, depressed mood, tearfulness, poor self confidence, feelings of loneliness (Status: maintained).  Problems Addressed  New Description  Goals 1. Felicia [name] experiences symptoms of depression related to difficulty connecting socially with others Objective Felicia [name] will increase her sense of well-being and connectedness and decrease symptoms of depression Target Date: 2023-05-04 Frequency: Biweekly  Progress: 0 Modality: individual  Related Interventions Therapist will provide referrals for additional resources as appropriate Therapist will work with Felicia [name] to develop her social skills incorporating role-play exercises and video modeling, such as through the [employer] role play videos Therapist will engage Kashlynn in behavioral activation, including the incorporation of structure, pleasant events, and mastery activities into her routine Therapist will provide Felicia [name] with strategies to regulate her emotions, including self-care, mindfulness, meditation, and relaxation exercises Therapist will help Felicia [name] to identify and disengage from maladaptive thought patterns using CBT-based strategies Therapist will provide Felicia [name] with opportunities to process her experiences in session Diagnosis Axis none 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

## 2022-06-04 ENCOUNTER — Ambulatory Visit: Payer: BC Managed Care – PPO | Admitting: Clinical

## 2022-06-18 ENCOUNTER — Ambulatory Visit (INDEPENDENT_AMBULATORY_CARE_PROVIDER_SITE_OTHER): Payer: BC Managed Care – PPO | Admitting: Clinical

## 2022-06-18 DIAGNOSIS — F331 Major depressive disorder, recurrent, moderate: Secondary | ICD-10-CM

## 2022-06-18 DIAGNOSIS — F840 Autistic disorder: Secondary | ICD-10-CM | POA: Diagnosis not present

## 2022-06-18 NOTE — Progress Notes (Signed)
Diagnosis: F84.0, F33.2 Time: 11:01 am-11:57 am CPT: [id number]  Felicia [name] was seen remotely using secure video conferencing due to the [place] pandemic. She was in her home in [place] and the therapist was in her office at the time of the appointment. Her mother joined the beginning of session to share several major developments, including the passing of Felicia [name]'s grandmother and having been awarded disability. Felicia [name] then met with the therapist individually, and shared that her boyfriend had ended the relationship on the same day as her grandmother's passing. Therapist processed this with her, exploring coping mechanisms, including maintaining structure in her day and exploring pleasant activities (she listed building gingerbread houses and reading [employer] geographic). She is scheduled to be seen again in two weeks.  Objectives Related Problem: Aquita experiences symptoms of depression related to difficulty connecting socially with others Description: Felicia [name] will increase her sense of well-being and connectedness and decrease symptoms of depression Target Date: 2023-05-04 Frequency: Biweekly Modality: individual Progress: 0% Planned Intervention: Therapist will provide referrals for additional resources as appropriate  Planned Intervention: Therapist will engage Felicia [name] in behavioral activation, including the incorporation of structure, pleasant events, and mastery activities into her routine  Planned Intervention: Therapist will help Felicia [name] to identify and disengage from maladaptive thought and behavior patterns using CBT-based strategies  Planned Intervention: Therapist will provide Felicia [name] with opportunities to process her experiences in session  Planned Intervention: Therapist will provide Felicia [name] with strategies to regulate her emotions, including self-care, mindfulness, meditation, and relaxation exercises  Treatment Plan Client Abilities/Strengths  Felicia [name] presents as motivated to improve her  situation.  Client Treatment Preferences  None noted.  Client Statement of Needs  Felicia [name] is seeking CBT to manage symptoms of depression related to difficulty in social interactions.  Treatment Level  Biweekly  Symptoms  Autism: difficulty perceiving and responding to social cues, engaging in reciprocal conversation (Status: maintained). Depression: sleep irregularity, depressed mood, tearfulness, poor self confidence, feelings of loneliness (Status: maintained).  Problems Addressed  New Description  Goals 1. Felicia [name] experiences symptoms of depression related to difficulty connecting socially with others Objective Felicia [name] will increase her sense of well-being and connectedness and decrease symptoms of depression Target Date: 2023-05-04 Frequency: Biweekly  Progress: 0 Modality: individual  Related Interventions Therapist will provide referrals for additional resources as appropriate Therapist will work with Felicia [name] to develop her social skills incorporating role-play exercises and video modeling, such as through the [employer] role play videos Therapist will engage Felicia [name] in behavioral activation, including the incorporation of structure, pleasant events, and mastery activities into her routine Therapist will provide Felicia [name] with strategies to regulate her emotions, including self-care, mindfulness, meditation, and relaxation exercises Therapist will help Felicia [name] to identify and disengage from maladaptive thought patterns using CBT-based strategies Therapist will provide Felicia [name] with opportunities to process her experiences in session Diagnosis Axis none 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

## 2022-07-02 ENCOUNTER — Ambulatory Visit (INDEPENDENT_AMBULATORY_CARE_PROVIDER_SITE_OTHER): Payer: BC Managed Care – PPO | Admitting: Clinical

## 2022-07-02 DIAGNOSIS — F331 Major depressive disorder, recurrent, moderate: Secondary | ICD-10-CM | POA: Diagnosis not present

## 2022-07-02 DIAGNOSIS — F840 Autistic disorder: Secondary | ICD-10-CM | POA: Diagnosis not present

## 2022-07-02 NOTE — Progress Notes (Signed)
Diagnosis: F84.0, F33.1 Time: 11:01 am-11:54 am CPT: [id number]  Sugar was seen remotely using secure video conferencing due to the [place] pandemic. She was in her home in [place] and the therapist was in her office at the time of the appointment. She shared that she has continued to experience emotional highs and lows related to her relationship since her last session. She had briefly gotten back together with her boyfriend, only to have him break up with her again. Therapist processed this with her, considering it in light of previous relationship, and exploring her hopes for the relationship. Therapist encouraged her to continue checking in with herself emotionally, especially prior to getting back into the relationship again. She is scheduled to be seen again in two weeks.  Objectives Related Problem: Claudetta experiences symptoms of depression related to difficulty connecting socially with others Description: Anacaren will increase her sense of well-being and connectedness and decrease symptoms of depression Target Date: 2023-05-04 Frequency: Biweekly Modality: individual Progress: 0% Planned Intervention: Therapist will provide referrals for additional resources as appropriate  Planned Intervention: Therapist will engage Tritia in behavioral activation, including the incorporation of structure, pleasant events, and mastery activities into her routine  Planned Intervention: Therapist will help Saira to identify and disengage from maladaptive thought and behavior patterns using CBT-based strategies  Planned Intervention: Therapist will provide Onia with opportunities to process her experiences in session  Planned Intervention: Therapist will provide Ajwa with strategies to regulate her emotions, including self-care, mindfulness, meditation, and relaxation exercises  Treatment Plan Client Abilities/Strengths  Kealie presents as motivated to improve her situation.  Client Treatment  Preferences  None noted.  Client Statement of Needs  Henley is seeking CBT to manage symptoms of depression related to difficulty in social interactions.  Treatment Level  Biweekly  Symptoms  Autism: difficulty perceiving and responding to social cues, engaging in reciprocal conversation (Status: maintained). Depression: sleep irregularity, depressed mood, tearfulness, poor self confidence, feelings of loneliness (Status: maintained).  Problems Addressed  New Description  Goals 1. Amarya experiences symptoms of depression related to difficulty connecting socially with others Objective Ahilyn will increase her sense of well-being and connectedness and decrease symptoms of depression Target Date: 2023-05-04 Frequency: Biweekly  Progress: 0 Modality: individual  Related Interventions Therapist will provide referrals for additional resources as appropriate Therapist will work with Johnice to develop her social skills incorporating role-play exercises and video modeling, such as through the [employer] role play videos Therapist will engage Tamee in behavioral activation, including the incorporation of structure, pleasant events, and mastery activities into her routine Therapist will provide [name] with strategies to regulate her emotions, including self-care, mindfulness, meditation, and relaxation exercises Therapist will help Emmy to identify and disengage from maladaptive thought patterns using CBT-based strategies Therapist will provide Mayre with opportunities to process her experiences in session Diagnosis Axis none 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

## 2022-07-16 ENCOUNTER — Ambulatory Visit (INDEPENDENT_AMBULATORY_CARE_PROVIDER_SITE_OTHER): Payer: BC Managed Care – PPO | Admitting: Clinical

## 2022-07-16 DIAGNOSIS — F840 Autistic disorder: Secondary | ICD-10-CM | POA: Diagnosis not present

## 2022-07-16 DIAGNOSIS — F331 Major depressive disorder, recurrent, moderate: Secondary | ICD-10-CM | POA: Diagnosis not present

## 2022-07-16 NOTE — Progress Notes (Signed)
Diagnosis: F84.0, F33.1 Time: 11:01 am-11:54 am CPT: [id number]  Felicia [name] was seen remotely using secure video conferencing due to the [place] pandemic. She was in her home in [place] and the therapist was in her office at the time of the appointment. Session focused on ongoing issues in her relationship. Therapist offered an opportunity to process, provided validation and support, and suggested communication strategies. She is scheduled to be seen again in two weeks.  Objectives Related Problem: [name] experiences symptoms of depression related to difficulty connecting socially with others Description: [name] will increase her sense of well-being and connectedness and decrease symptoms of depression Target Date: 2023-05-04 Frequency: Biweekly Modality: individual Progress: 0% Planned Intervention: Therapist will provide referrals for additional resources as appropriate  Planned Intervention: Therapist will engage [name] in behavioral activation, including the incorporation of structure, pleasant events, and mastery activities into her routine  Planned Intervention: Therapist will help [name] to identify and disengage from maladaptive thought and behavior patterns using CBT-based strategies  Planned Intervention: Therapist will provide [name] with opportunities to process her experiences in session  Planned Intervention: Therapist will provide [name] with strategies to regulate her emotions, including self-care, mindfulness, meditation, and relaxation exercises  Treatment Plan Client Abilities/Strengths  [name] presents as motivated to improve her situation.  Client Treatment Preferences  None noted.  Client Statement of Needs  [name] is seeking CBT to manage symptoms of depression related to difficulty in social interactions.  Treatment Level  Biweekly  Symptoms  Autism: difficulty perceiving and responding to social cues, engaging in reciprocal conversation (Status: maintained). Depression:  sleep irregularity, depressed mood, tearfulness, poor self confidence, feelings of loneliness (Status: maintained).  Problems Addressed  New Description  Goals 1. [name] experiences symptoms of depression related to difficulty connecting socially with others Objective [name] will increase her sense of well-being and connectedness and decrease symptoms of depression Target Date: 2023-05-04 Frequency: Biweekly  Progress: 0 Modality: individual  Related Interventions Therapist will provide referrals for additional resources as appropriate Therapist will work with [name] to develop her social skills incorporating role-play exercises and video modeling, such as through the [employer] role play videos Therapist will engage [name] in behavioral activation, including the incorporation of structure, pleasant events, and mastery activities into her routine Therapist will provide [name] with strategies to regulate her emotions, including self-care, mindfulness, meditation, and relaxation exercises Therapist will help [name] to identify and disengage from maladaptive thought patterns using CBT-based strategies Therapist will provide [name] with opportunities to process her experiences in session Diagnosis Axis none 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

## 2022-07-21 DIAGNOSIS — F419 Anxiety disorder, unspecified: Secondary | ICD-10-CM | POA: Diagnosis not present

## 2022-07-21 DIAGNOSIS — F900 Attention-deficit hyperactivity disorder, predominantly inattentive type: Secondary | ICD-10-CM | POA: Diagnosis not present

## 2022-07-21 DIAGNOSIS — F3489 Other specified persistent mood disorders: Secondary | ICD-10-CM | POA: Diagnosis not present

## 2022-07-30 ENCOUNTER — Ambulatory Visit (INDEPENDENT_AMBULATORY_CARE_PROVIDER_SITE_OTHER): Payer: BC Managed Care – PPO | Admitting: Clinical

## 2022-07-30 DIAGNOSIS — F331 Major depressive disorder, recurrent, moderate: Secondary | ICD-10-CM | POA: Diagnosis not present

## 2022-07-30 DIAGNOSIS — F840 Autistic disorder: Secondary | ICD-10-CM

## 2022-07-30 NOTE — Progress Notes (Signed)
Diagnosis: F84.0, F33.1 Time: 11:01 am-11:54 am CPT: [id number]  Felicia [name] was seen remotely using secure video conferencing due to the [place] pandemic. She was in her home in [place] and the therapist was in her office at the time of the appointment. She reported a relatively stable mood since her last session, and that she had not heard from her former boyfriend since shortly after her last session. Session focused on boundary setting in relationships. She is scheduled to be seen again in two weeks.  Objectives Related Problem: Felicia [name] experiences symptoms of depression related to difficulty connecting socially with others Description: Felicia [name] will increase her sense of well-being and connectedness and decrease symptoms of depression Target Date: 2023-05-04 Frequency: Biweekly Modality: individual Progress: 0% Planned Intervention: Therapist will provide referrals for additional resources as appropriate  Planned Intervention: Therapist will engage Felicia [name] in behavioral activation, including the incorporation of structure, pleasant events, and mastery activities into her routine  Planned Intervention: Therapist will help Felicia [name] to identify and disengage from maladaptive thought and behavior patterns using CBT-based strategies  Planned Intervention: Therapist will provide Felicia [name] with opportunities to process her experiences in session  Planned Intervention: Therapist will provide Felicia [name] with strategies to regulate her emotions, including self-care, mindfulness, meditation, and relaxation exercises  Treatment Plan Client Abilities/Strengths  Felicia [name] presents as motivated to improve her situation.  Client Treatment Preferences  None noted.  Client Statement of Needs  Felicia [name] is seeking CBT to manage symptoms of depression related to difficulty in social interactions.  Treatment Level  Biweekly  Symptoms  Autism: difficulty perceiving and responding to social cues, engaging in reciprocal conversation  (Status: maintained). Depression: sleep irregularity, depressed mood, tearfulness, poor self confidence, feelings of loneliness (Status: maintained).  Problems Addressed  New Description  Goals 1. Felicia [name] experiences symptoms of depression related to difficulty connecting socially with others Objective Felicia [name] will increase her sense of well-being and connectedness and decrease symptoms of depression Target Date: 2023-05-04 Frequency: Biweekly  Progress: 0 Modality: individual  Related Interventions Therapist will provide referrals for additional resources as appropriate Therapist will work with Felicia [name] to develop her social skills incorporating role-play exercises and video modeling, such as through the [employer] role play videos Therapist will engage Felicia [name] in behavioral activation, including the incorporation of structure, pleasant events, and mastery activities into her routine Therapist will provide Felicia [name] with strategies to regulate her emotions, including self-care, mindfulness, meditation, and relaxation exercises Therapist will help Felicia [name] to identify and disengage from maladaptive thought patterns using CBT-based strategies Therapist will provide Felicia [name] with opportunities to process her experiences in session Diagnosis Axis none 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

## 2022-08-13 ENCOUNTER — Ambulatory Visit (INDEPENDENT_AMBULATORY_CARE_PROVIDER_SITE_OTHER): Payer: BC Managed Care – PPO | Admitting: Clinical

## 2022-08-13 DIAGNOSIS — F840 Autistic disorder: Secondary | ICD-10-CM

## 2022-08-13 DIAGNOSIS — F331 Major depressive disorder, recurrent, moderate: Secondary | ICD-10-CM

## 2022-08-13 NOTE — Progress Notes (Signed)
Diagnosis: F84.0, F33.1 Time: 11:01 am-11:56 am CPT: [id number]  Felicia [name] was seen remotely using secure video conferencing due to the [place] pandemic. She was in her home in [place] and the therapist was in her office at the time of the appointment. She reflected upon several developments in her relationships since her last session, including learning a friend to whom she no longer speaks had gotten engaged, as well as having gone two weeks without hearing from her former love interest. Therapist discussed the difference between boundary setting and communicating boundaries, and worked with Felicia [name] to consider how she can set and uphold boundaries in relationships. She is scheduled to be seen again in two weeks.  Objectives Related Problem: Felicia [name] experiences symptoms of depression related to difficulty connecting socially with others Description: Felicia [name] will increase her sense of well-being and connectedness and decrease symptoms of depression Target Date: 2023-05-04 Frequency: Biweekly Modality: individual Progress: 0% Planned Intervention: Therapist will provide referrals for additional resources as appropriate  Planned Intervention: Therapist will engage Felicia [name] in behavioral activation, including the incorporation of structure, pleasant events, and mastery activities into her routine  Planned Intervention: Therapist will help Felicia [name] to identify and disengage from maladaptive thought and behavior patterns using CBT-based strategies  Planned Intervention: Therapist will provide Felicia [name] with opportunities to process her experiences in session  Planned Intervention: Therapist will provide Felicia [name] with strategies to regulate her emotions, including self-care, mindfulness, meditation, and relaxation exercises  Treatment Plan Client Abilities/Strengths  Felicia [name] presents as motivated to improve her situation.  Client Treatment Preferences  None noted.  Client Statement of Needs  Felicia [name] is seeking CBT to  manage symptoms of depression related to difficulty in social interactions.  Treatment Level  Biweekly  Symptoms  Autism: difficulty perceiving and responding to social cues, engaging in reciprocal conversation (Status: maintained). Depression: sleep irregularity, depressed mood, tearfulness, poor self confidence, feelings of loneliness (Status: maintained).  Problems Addressed  New Description  Goals 1. Felicia [name] experiences symptoms of depression related to difficulty connecting socially with others Objective Felicia [name] will increase her sense of well-being and connectedness and decrease symptoms of depression Target Date: 2023-05-04 Frequency: Biweekly  Progress: 0 Modality: individual  Related Interventions Therapist will provide referrals for additional resources as appropriate Therapist will work with [name] to develop her social skills incorporating role-play exercises and video modeling, such as through the [employer] role play videos Therapist will engage [name] in behavioral activation, including the incorporation of structure, pleasant events, and mastery activities into her routine Therapist will provide [name] with strategies to regulate her emotions, including self-care, mindfulness, meditation, and relaxation exercises Therapist will help [name] to identify and disengage from maladaptive thought patterns using CBT-based strategies Therapist will provide [name] with opportunities to process her experiences in session Diagnosis Axis none 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

## 2022-08-27 ENCOUNTER — Ambulatory Visit (INDEPENDENT_AMBULATORY_CARE_PROVIDER_SITE_OTHER): Payer: BC Managed Care – PPO | Admitting: Clinical

## 2022-08-27 DIAGNOSIS — F331 Major depressive disorder, recurrent, moderate: Secondary | ICD-10-CM

## 2022-08-27 DIAGNOSIS — F840 Autistic disorder: Secondary | ICD-10-CM | POA: Diagnosis not present

## 2022-08-27 NOTE — Progress Notes (Signed)
Diagnosis: F84.0, F33.1 Time: 11:01 am-11:56 am CPT: [id number]  Felicia [name] was seen remotely using secure video conferencing due to the [place] pandemic. She was in her home in [place] and the therapist was in her office at the time of the appointment. She reflected upon her sense of frustration with a friend whom she perceives as consistently receiving more praise than she does. Therapist processed this with her, exploring how much may relate to her own internal comparison to her friend, and encouraging her to reflect upon her own strengths for homework. She is scheduled to be seen again in two weeks.  Objectives Related Problem: [name] experiences symptoms of depression related to difficulty connecting socially with others Description: [name] will increase her sense of well-being and connectedness and decrease symptoms of depression Target Date: 2023-05-04 Frequency: Biweekly Modality: individual Progress: 0% Planned Intervention: Therapist will provide referrals for additional resources as appropriate  Planned Intervention: Therapist will engage [name] in behavioral activation, including the incorporation of structure, pleasant events, and mastery activities into her routine  Planned Intervention: Therapist will help [name] to identify and disengage from maladaptive thought and behavior patterns using CBT-based strategies  Planned Intervention: Therapist will provide [name] with opportunities to process her experiences in session  Planned Intervention: Therapist will provide [name] with strategies to regulate her emotions, including self-care, mindfulness, meditation, and relaxation exercises  Treatment Plan Client Abilities/Strengths  [name] presents as motivated to improve her situation.  Client Treatment Preferences  None noted.  Client Statement of Needs  [name] is seeking CBT to manage symptoms of depression related to difficulty in social interactions.  Treatment Level  Biweekly   Symptoms  Autism: difficulty perceiving and responding to social cues, engaging in reciprocal conversation (Status: maintained). Depression: sleep irregularity, depressed mood, tearfulness, poor self confidence, feelings of loneliness (Status: maintained).  Problems Addressed  New Description  Goals 1. [name] experiences symptoms of depression related to difficulty connecting socially with others Objective [name] will increase her sense of well-being and connectedness and decrease symptoms of depression Target Date: 2023-05-04 Frequency: Biweekly  Progress: 0 Modality: individual  Related Interventions Therapist will provide referrals for additional resources as appropriate Therapist will work with [name] to develop her social skills incorporating role-play exercises and video modeling, such as through the [employer] role play videos Therapist will engage [name] in behavioral activation, including the incorporation of structure, pleasant events, and mastery activities into her routine Therapist will provide [name] with strategies to regulate her emotions, including self-care, mindfulness, meditation, and relaxation exercises Therapist will help [name] to identify and disengage from maladaptive thought patterns using CBT-based strategies Therapist will provide [name] with opportunities to process her experiences in session Diagnosis Axis none 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

## 2022-09-10 ENCOUNTER — Ambulatory Visit (INDEPENDENT_AMBULATORY_CARE_PROVIDER_SITE_OTHER): Payer: BC Managed Care – PPO | Admitting: Clinical

## 2022-09-10 DIAGNOSIS — F840 Autistic disorder: Secondary | ICD-10-CM

## 2022-09-10 DIAGNOSIS — F331 Major depressive disorder, recurrent, moderate: Secondary | ICD-10-CM | POA: Diagnosis not present

## 2022-09-10 NOTE — Progress Notes (Signed)
Diagnosis: F84.0, F33.1 Time: 11:01 am-11:56 am CPT: [id number]  Felicia [name] was seen remotely using secure video conferencing due to the [place] pandemic. She was in her home in [place] and the therapist was in her office at the time of the appointment. Session focused on continuing to unpack her frustration when a particular friend receives compliments, as well as her perception that others are competing with her and bragging when they talk about their jobs. For homework, Felicia [name] will track how often this happens and prepare to share specific examples. She is scheduled to be seen again in two weeks.  Objectives Related Problem: Felicia [name] experiences symptoms of depression related to difficulty connecting socially with others Description: Felicia [name] will increase her sense of well-being and connectedness and decrease symptoms of depression Target Date: 2023-05-04 Frequency: Biweekly Modality: individual Progress: 0% Planned Intervention: Therapist will provide referrals for additional resources as appropriate  Planned Intervention: Therapist will engage Felicia [name] in behavioral activation, including the incorporation of structure, pleasant events, and mastery activities into her routine  Planned Intervention: Therapist will help Felicia [name] to identify and disengage from maladaptive thought and behavior patterns using CBT-based strategies  Planned Intervention: Therapist will provide Felicia [name] with opportunities to process her experiences in session  Planned Intervention: Therapist will provide Felicia [name] with strategies to regulate her emotions, including self-care, mindfulness, meditation, and relaxation exercises  Treatment Plan Client Abilities/Strengths  Felicia [name] presents as motivated to improve her situation.  Client Treatment Preferences  None noted.  Client Statement of Needs  Felicia [name] is seeking CBT to manage symptoms of depression related to difficulty in social interactions.  Treatment Level  Biweekly  Symptoms   Autism: difficulty perceiving and responding to social cues, engaging in reciprocal conversation (Status: maintained). Depression: sleep irregularity, depressed mood, tearfulness, poor self confidence, feelings of loneliness (Status: maintained).  Problems Addressed  New Description  Goals 1. Felicia [name] experiences symptoms of depression related to difficulty connecting socially with others Objective Felicia [name] will increase her sense of well-being and connectedness and decrease symptoms of depression Target Date: 2023-05-04 Frequency: Biweekly  Progress: 0 Modality: individual  Related Interventions Therapist will provide referrals for additional resources as appropriate Therapist will work with Felicia [name] to develop her social skills incorporating role-play exercises and video modeling, such as through the [employer] role play videos Therapist will engage Felicia [name] in behavioral activation, including the incorporation of structure, pleasant events, and mastery activities into her routine Therapist will provide Felicia [name] with strategies to regulate her emotions, including self-care, mindfulness, meditation, and relaxation exercises Therapist will help Felicia [name] to identify and disengage from maladaptive thought patterns using CBT-based strategies Therapist will provide Felicia [name] with opportunities to process her experiences in session Diagnosis Axis none 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

## 2022-09-24 ENCOUNTER — Ambulatory Visit (INDEPENDENT_AMBULATORY_CARE_PROVIDER_SITE_OTHER): Payer: Medicare Other | Admitting: Clinical

## 2022-09-24 DIAGNOSIS — F840 Autistic disorder: Secondary | ICD-10-CM | POA: Diagnosis not present

## 2022-09-24 DIAGNOSIS — F331 Major depressive disorder, recurrent, moderate: Secondary | ICD-10-CM | POA: Diagnosis not present

## 2022-09-24 NOTE — Progress Notes (Signed)
Diagnosis: F84.0, F33.1 Time: 11:01 am-11:56 am CPT: [id number]  Felicia [name] was seen remotely using secure video conferencing due to the COVID19 pandemic. She was in her home in [place] and the therapist was in her office at the time of the appointment. Felicia [name] had not found examples of people praising her friend. Therapist suggested she may be experiencing confirmation bias, and encouraged her to track instances where other people respond in ways that make her feel appreciated. Therapist also provided psychoeducation on emotions vs actions and impact vs intent. She is scheduled to be seen again in two weeks.  Objectives Related Problem: Felicia [name] experiences symptoms of depression related to difficulty connecting socially with others Description: Felicia [name] will increase her sense of well-being and connectedness and decrease symptoms of depression Target Date: 2023-05-04 Frequency: Biweekly Modality: individual Progress: 0% Planned Intervention: Therapist will provide referrals for additional resources as appropriate  Planned Intervention: Therapist will engage Felicia [name] in behavioral activation, including the incorporation of structure, pleasant events, and mastery activities into her routine  Planned Intervention: Therapist will help Felicia [name] to identify and disengage from maladaptive thought and behavior patterns using CBT-based strategies  Planned Intervention: Therapist will provide Felicia [name] with opportunities to process her experiences in session  Planned Intervention: Therapist will provide Felicia [name] with strategies to regulate her emotions, including self-care, mindfulness, meditation, and relaxation exercises  Treatment Plan Client Abilities/Strengths  Felicia [name] presents as motivated to improve her situation.  Client Treatment Preferences  None noted.  Client Statement of Needs  Felicia [name] is seeking CBT to manage symptoms of depression related to difficulty in social interactions.  Treatment Level  Biweekly   Symptoms  Autism: difficulty perceiving and responding to social cues, engaging in reciprocal conversation (Status: maintained). Depression: sleep irregularity, depressed mood, tearfulness, poor self confidence, feelings of loneliness (Status: maintained).  Problems Addressed  New Description  Goals 1. [name] experiences symptoms of depression related to difficulty connecting socially with others Objective Felicia [name] will increase her sense of well-being and connectedness and decrease symptoms of depression Target Date: 2023-05-04 Frequency: Biweekly  Progress: 0 Modality: individual  Related Interventions Therapist will provide referrals for additional resources as appropriate Therapist will work with Felicia [name] to develop her social skills incorporating role-play exercises and video modeling, such as through the [employer] role play videos Therapist will engage Felicia [name] in behavioral activation, including the incorporation of structure, pleasant events, and mastery activities into her routine Therapist will provide Felicia [name] with strategies to regulate her emotions, including self-care, mindfulness, meditation, and relaxation exercises Therapist will help Felicia [name] to identify and disengage from maladaptive thought patterns using CBT-based strategies Therapist will provide Felicia [name] with opportunities to process her experiences in session Diagnosis Axis none 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

## 2022-10-08 ENCOUNTER — Ambulatory Visit (INDEPENDENT_AMBULATORY_CARE_PROVIDER_SITE_OTHER): Payer: Medicare Other | Admitting: Clinical

## 2022-10-08 DIAGNOSIS — F840 Autistic disorder: Secondary | ICD-10-CM | POA: Diagnosis not present

## 2022-10-08 DIAGNOSIS — F331 Major depressive disorder, recurrent, moderate: Secondary | ICD-10-CM

## 2022-10-08 NOTE — Progress Notes (Signed)
Diagnosis: F84.0, F33.1 Time: 11:01 am-11:56 am CPT: [id number]  Felicia [name] was seen remotely using secure video conferencing due to the COVID19 pandemic. She was in her home in [place] and the therapist was in her office at the time of the appointment. Felicia [name] had recorded an example of becoming frustrated with her friend, and was able to think of an example of a time when she felt appreciated. Therapist encouraged her to continue noticing when she feels appreciated by others. Session focused on processing events in her last relationship. She is scheduled to be seen again in two weeks.  Objectives Related Problem: Felicia [name] experiences symptoms of depression related to difficulty connecting socially with others Description: Felicia [name] will increase her sense of well-being and connectedness and decrease symptoms of depression Target Date: 2023-05-04 Frequency: Biweekly Modality: individual Progress: 0% Planned Intervention: Therapist will provide referrals for additional resources as appropriate  Planned Intervention: Therapist will engage Felicia [name] in behavioral activation, including the incorporation of structure, pleasant events, and mastery activities into her routine  Planned Intervention: Therapist will help Felicia [name] to identify and disengage from maladaptive thought and behavior patterns using CBT-based strategies  Planned Intervention: Therapist will provide Felicia [name] with opportunities to process her experiences in session  Planned Intervention: Therapist will provide Felicia [name] with strategies to regulate her emotions, including self-care, mindfulness, meditation, and relaxation exercises  Treatment Plan Client Abilities/Strengths  Felicia [name] presents as motivated to improve her situation.  Client Treatment Preferences  None noted.  Client Statement of Needs  Felicia [name] is seeking CBT to manage symptoms of depression related to difficulty in social interactions.  Treatment Level  Biweekly  Symptoms  Autism:  difficulty perceiving and responding to social cues, engaging in reciprocal conversation (Status: maintained). Depression: sleep irregularity, depressed mood, tearfulness, poor self confidence, feelings of loneliness (Status: maintained).  Problems Addressed  New Description  Goals 1. Zamani experiences symptoms of depression related to difficulty connecting socially with others Objective Felicia [name] will increase her sense of well-being and connectedness and decrease symptoms of depression Target Date: 2023-05-04 Frequency: Biweekly  Progress: 0 Modality: individual  Related Interventions Therapist will provide referrals for additional resources as appropriate Therapist will work with Felicia [name] to develop her social skills incorporating role-play exercises and video modeling, such as through the [employer] role play videos Therapist will engage Felicia [name] in behavioral activation, including the incorporation of structure, pleasant events, and mastery activities into her routine Therapist will provide Felicia [name] with strategies to regulate her emotions, including self-care, mindfulness, meditation, and relaxation exercises Therapist will help Felicia [name] to identify and disengage from maladaptive thought patterns using CBT-based strategies Therapist will provide Felicia [name] with opportunities to process her experiences in session Diagnosis Axis none 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

## 2022-10-22 ENCOUNTER — Ambulatory Visit (INDEPENDENT_AMBULATORY_CARE_PROVIDER_SITE_OTHER): Payer: Medicare Other | Admitting: Clinical

## 2022-10-22 DIAGNOSIS — F840 Autistic disorder: Secondary | ICD-10-CM

## 2022-10-22 NOTE — Progress Notes (Signed)
Diagnosis: F84.0, F33.1 Time: 11:01 am-11:56 am CPT: [id number]  Felicia [name] was seen remotely using secure video conferencing due to the COVID19 pandemic. She was in her home in [place] and the therapist was in her office at the time of the appointment. Session focused on recent stressors in her life, as well as ongoing issues in her relationships. For homework, she will try to shift focus to making new friends, as well as friends with whom she has not been in contact. She is scheduled to be seen again in two weeks.  Objectives Related Problem: Felicia [name] experiences symptoms of depression related to difficulty connecting socially with others Description: Felicia [name] will increase her sense of well-being and connectedness and decrease symptoms of depression Target Date: 2023-05-04 Frequency: Biweekly Modality: individual Progress: 0% Planned Intervention: Therapist will provide referrals for additional resources as appropriate  Planned Intervention: Therapist will engage Felicia [name] in behavioral activation, including the incorporation of structure, pleasant events, and mastery activities into her routine  Planned Intervention: Therapist will help Felicia [name] to identify and disengage from maladaptive thought and behavior patterns using CBT-based strategies  Planned Intervention: Therapist will provide Felicia [name] with opportunities to process her experiences in session  Planned Intervention: Therapist will provide Felicia [name] with strategies to regulate her emotions, including self-care, mindfulness, meditation, and relaxation exercises  Treatment Plan Client Abilities/Strengths  Felicia [name] presents as motivated to improve her situation.  Client Treatment Preferences  None noted.  Client Statement of Needs  Felicia [name] is seeking CBT to manage symptoms of depression related to difficulty in social interactions.  Treatment Level  Biweekly  Symptoms  Autism: difficulty perceiving and responding to social cues, engaging in reciprocal  conversation (Status: maintained). Depression: sleep irregularity, depressed mood, tearfulness, poor self confidence, feelings of loneliness (Status: maintained).  Problems Addressed  New Description  Goals 1. Felicia [name] experiences symptoms of depression related to difficulty connecting socially with others Objective Felicia [name] will increase her sense of well-being and connectedness and decrease symptoms of depression Target Date: 2023-05-04 Frequency: Biweekly  Progress: 0 Modality: individual  Related Interventions Therapist will provide referrals for additional resources as appropriate Therapist will work with Felicia [name] to develop her social skills incorporating role-play exercises and video modeling, such as through the [employer] role play videos Therapist will engage Felicia [name] in behavioral activation, including the incorporation of structure, pleasant events, and mastery activities into her routine Therapist will provide Felicia [name] with strategies to regulate her emotions, including self-care, mindfulness, meditation, and relaxation exercises Therapist will help Felicia [name] to identify and disengage from maladaptive thought patterns using CBT-based strategies Therapist will provide Felicia [name] with opportunities to process her experiences in session Diagnosis Axis none 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 She was in her home in [place] and the therapist was in her office at the time of the appointment. Felicia [name] had recorded an example of  becoming frustrated with her friend, and was able to think of an example of a time  when she felt appreciated. Therapist encouraged her to continue noticing when she feels appreciated by others. Session focused on processing events in her last relationship. She is scheduled to be seen again in two weeks.  Objectives Related Problem: Felicia [name] experiences symptoms of depression related to difficulty connecting socially with others Description: Felicia [name] will increase her sense of well-being and connectedness and decrease symptoms of depression Target Date: 2023-05-04 Frequency: Biweekly Modality: individual Progress: 0% Planned Intervention: Therapist will provide referrals for additional resources as appropriate  Planned Intervention: Therapist will engage Felicia [name] in behavioral activation, including the incorporation of structure, pleasant events, and mastery activities into her routine  Planned Intervention: Therapist will help Felicia [name] to identify and disengage from maladaptive thought and behavior patterns using CBT-based strategies  Planned Intervention: Therapist will provide Felicia [name] with opportunities to process her experiences in session  Planned Intervention: Therapist will provide Felicia [name] with strategies to regulate her emotions, including self-care, mindfulness, meditation, and relaxation exercises  Treatment Plan Client Abilities/Strengths  Felicia [name] presents as motivated to improve her situation.  Client Treatment Preferences  None noted.  Client Statement of Needs  Felicia [name] is seeking CBT to manage symptoms of depression related to difficulty in social interactions.  Treatment Level  Biweekly  Symptoms  Autism: difficulty perceiving and responding to social cues, engaging in reciprocal conversation (Status: maintained). Depression: sleep irregularity, depressed mood, tearfulness, poor self confidence, feelings of loneliness (Status: maintained).  Problems Addressed  New Description  Goals 1. Felicia [name]  experiences symptoms of depression related to difficulty connecting socially with others Objective Almyra will increase her sense of well-being and connectedness and decrease symptoms of depression Target Date: 2023-05-04 Frequency: Biweekly  Progress: 0 Modality: individual  Related Interventions Therapist will provide referrals for additional resources as appropriate Therapist will work with Kynlea to develop her social skills incorporating role-play exercises and video modeling, such as through the [employer] role play videos Therapist will engage Myrel in behavioral activation, including the incorporation of structure, pleasant events, and mastery activities into her routine Therapist will provide August with strategies to regulate her emotions, including self-care, mindfulness, meditation, and relaxation exercises Therapist will help Monta to identify and disengage from maladaptive thought patterns using CBT-based strategies Therapist will provide Lakita with opportunities to process her experiences in session Diagnosis Axis none 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

## 2022-10-27 DIAGNOSIS — Z1331 Encounter for screening for depression: Secondary | ICD-10-CM | POA: Diagnosis not present

## 2022-10-27 DIAGNOSIS — F3489 Other specified persistent mood disorders: Secondary | ICD-10-CM | POA: Diagnosis not present

## 2022-10-27 DIAGNOSIS — F419 Anxiety disorder, unspecified: Secondary | ICD-10-CM | POA: Diagnosis not present

## 2022-10-27 DIAGNOSIS — F900 Attention-deficit hyperactivity disorder, predominantly inattentive type: Secondary | ICD-10-CM | POA: Diagnosis not present

## 2022-11-05 ENCOUNTER — Ambulatory Visit (INDEPENDENT_AMBULATORY_CARE_PROVIDER_SITE_OTHER): Payer: Medicare Other | Admitting: Clinical

## 2022-11-05 DIAGNOSIS — F840 Autistic disorder: Secondary | ICD-10-CM

## 2022-11-05 DIAGNOSIS — F331 Major depressive disorder, recurrent, moderate: Secondary | ICD-10-CM | POA: Diagnosis not present

## 2022-11-05 NOTE — Progress Notes (Signed)
Diagnosis: F84.0, F33.1 Time: 11:01 am-11:54 am CPT: 96295M-84  Felicia [name] was seen remotely using secure video conferencing due to the COVID19 pandemic. She was in her home in [place] and the therapist was in her office at the time of the appointment. Session began by the therapist working with Felicia [name] and her mother to address technical difficulties. Felicia [name] then reflected upon growing dissatisfaction with her job, which had caused her to explore the idea of returning to school for the first time since graduating high school. Therapist provided an opportunity to process, encouraging her to consider which circumstances in high school were most enjoyable for her and most conducive to success.  She is scheduled to be seen again in two weeks.  Objectives Related Problem: Felicia [name] experiences symptoms of depression related to difficulty connecting socially with others Description: Felicia [name] will increase her sense of well-being and connectedness and decrease symptoms of depression Target Date: 2023-05-04 Frequency: Biweekly Modality: individual Progress: 0% Planned Intervention: Therapist will provide referrals for additional resources as appropriate  Planned Intervention: Therapist will engage Felicia [name] in behavioral activation, including the incorporation of structure, pleasant events, and mastery activities into her routine  Planned Intervention: Therapist will help Felicia [name] to identify and disengage from maladaptive thought and behavior patterns using CBT-based strategies  Planned Intervention: Therapist will provide Felicia [name] with opportunities to process her experiences in session  Planned Intervention: Therapist will provide Felicia [name] with strategies to regulate her emotions, including self-care, mindfulness, meditation, and relaxation exercises  Treatment Plan Client Abilities/Strengths  Felicia [name] presents as motivated to improve her situation.  Client Treatment Preferences  None noted.  Client Statement of Needs   Felicia [name] is seeking CBT to manage symptoms of depression related to difficulty in social interactions.  Treatment Level  Biweekly  Symptoms  Autism: difficulty perceiving and responding to social cues, engaging in reciprocal conversation (Status: maintained). Depression: sleep irregularity, depressed mood, tearfulness, poor self confidence, feelings of loneliness (Status: maintained).  Problems Addressed  New Description  Goals 1. Felicia [name] experiences symptoms of depression related to difficulty connecting socially with others Objective Felicia [name] will increase her sense of well-being and connectedness and decrease symptoms of depression Target Date: 2023-05-04 Frequency: Biweekly  Progress: 0 Modality: individual  Related Interventions Therapist will provide referrals for additional resources as appropriate Therapist will work with Felicia [name] to develop her social skills incorporating role-play exercises and video modeling, such as through the [employer] role play videos Therapist will engage Felicia [name] in behavioral activation, including the incorporation of structure, pleasant events, and mastery activities into her routine Therapist will provide Felicia [name] with strategies to regulate her emotions, including self-care, mindfulness, meditation, and relaxation exercises Therapist will help Felicia [name] to identify and disengage from maladaptive thought patterns using CBT-based strategies Therapist will provide Felicia [name] with opportunities to process her experiences in session Diagnosis Axis none 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 She was in her home in [place] and the therapist was in  her office at the time of the appointment. Felicia [name] had recorded an example of becoming frustrated with her friend, and was able to think of an example of a time when she felt appreciated. Therapist encouraged her to continue noticing when she feels appreciated by others. Session focused on processing events in her last relationship. She is scheduled to be seen again in two weeks.  Objectives Related Problem: Felicia [name] experiences symptoms of depression related to difficulty connecting socially with others Description: Felicia [name] will increase her sense of well-being and connectedness and decrease symptoms of depression Target Date: 2023-05-04 Frequency: Biweekly Modality: individual Progress: 0% Planned Intervention: Therapist will provide referrals for additional resources as appropriate  Planned Intervention: Therapist will engage Felicia [name] in behavioral activation, including the incorporation of structure, pleasant events, and mastery activities into her routine  Planned Intervention: Therapist will help Felicia [name] to identify and disengage from maladaptive thought and behavior patterns using CBT-based strategies  Planned Intervention: Therapist will provide Felicia [name] with opportunities to process her experiences in session  Planned Intervention: Therapist will provide Felicia [name] with strategies to regulate her emotions, including self-care, mindfulness, meditation, and relaxation exercises  Treatment Plan Client Abilities/Strengths  Felicia [name] presents as motivated to improve her situation.  Client Treatment Preferences  None noted.  Client Statement of Needs  Felicia [name] is seeking CBT to manage symptoms of depression related to difficulty in social interactions.  Treatment Level  Biweekly  Symptoms  Autism: difficulty perceiving and responding to social cues, engaging  in reciprocal conversation (Status: maintained). Depression: sleep irregularity, depressed mood, tearfulness, poor self confidence, feelings of loneliness (Status: maintained).  Problems Addressed  New Description  Goals 1. Kendall experiences symptoms of depression related to difficulty connecting socially with others Objective Magaby will increase her sense of well-being and connectedness and decrease symptoms of depression Target Date: 2023-05-04 Frequency: Biweekly  Progress: 0 Modality: individual  Related Interventions Therapist will provide referrals for additional resources as appropriate Therapist will work with Roberta to develop her social skills incorporating role-play exercises and video modeling, such as through the [employer] role play videos Therapist will engage Serah in behavioral activation, including the incorporation of structure, pleasant events, and mastery activities into her routine Therapist will provide Eilleen with strategies to regulate her emotions, including self-care, mindfulness, meditation, and relaxation exercises Therapist will help Shavontae to identify and disengage from maladaptive thought patterns using CBT-based strategies Therapist will provide Joette with opportunities to process her experiences in session Diagnosis Axis none 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

## 2022-11-13 DIAGNOSIS — B370 Candidal stomatitis: Secondary | ICD-10-CM | POA: Diagnosis not present

## 2022-11-13 DIAGNOSIS — R059 Cough, unspecified: Secondary | ICD-10-CM | POA: Diagnosis not present

## 2022-11-13 DIAGNOSIS — J309 Allergic rhinitis, unspecified: Secondary | ICD-10-CM | POA: Diagnosis not present

## 2022-11-19 ENCOUNTER — Ambulatory Visit (INDEPENDENT_AMBULATORY_CARE_PROVIDER_SITE_OTHER): Payer: Medicare Other | Admitting: Clinical

## 2022-11-19 DIAGNOSIS — F840 Autistic disorder: Secondary | ICD-10-CM

## 2022-11-19 NOTE — Progress Notes (Signed)
Diagnosis: F84.0, F33.1 Time: 11:01 am-11:54 am CPT: 16109U-04  Felicia [name] was seen remotely using secure video conferencing due to the COVID19 pandemic. She was in her home in [place] and the therapist was in her office at the time of the appointment. Session began by the therapist working with Felicia [name] and her mother to address technical difficulties. Session then focused on developments in Felicia [name]'s relationships. Therapist encouraged her to consider how she might set and maintain healthy boundaries in the situations she described. She is scheduled to be seen again in one month.  Objectives Related Problem: Felicia [name] experiences symptoms of depression related to difficulty connecting socially with others Description: Felicia [name] will increase her sense of well-being and connectedness and decrease symptoms of depression Target Date: 2023-05-04 Frequency: Biweekly Modality: individual Progress: 0% Planned Intervention: Therapist will provide referrals for additional resources as appropriate  Planned Intervention: Therapist will engage Felicia [name] in behavioral activation, including the incorporation of structure, pleasant events, and mastery activities into her routine  Planned Intervention: Therapist will help Felicia [name] to identify and disengage from maladaptive thought and behavior patterns using CBT-based strategies  Planned Intervention: Therapist will provide Felicia [name] with opportunities to process her experiences in session  Planned Intervention: Therapist will provide Felicia [name] with strategies to regulate her emotions, including self-care, mindfulness, meditation, and relaxation exercises  Treatment Plan Client Abilities/Strengths  Felicia [name] presents as motivated to improve her situation.  Client Treatment Preferences  None noted.  Client Statement of Needs  Felicia [name] is seeking CBT to manage symptoms of depression related to difficulty in social interactions.  Treatment Level  Biweekly  Symptoms  Autism: difficulty  perceiving and responding to social cues, engaging in reciprocal conversation (Status: maintained). Depression: sleep irregularity, depressed mood, tearfulness, poor self confidence, feelings of loneliness (Status: maintained).  Problems Addressed  New Description  Goals 1. Felicia [name] experiences symptoms of depression related to difficulty connecting socially with others Objective Felicia [name] will increase her sense of well-being and connectedness and decrease symptoms of depression Target Date: 2023-05-04 Frequency: Biweekly  Progress: 0 Modality: individual  Related Interventions Therapist will provide referrals for additional resources as appropriate Therapist will work with Felicia [name] to develop her social skills incorporating role-play exercises and video modeling, such as through the [employer] role play videos Therapist will engage Felicia [name] in behavioral activation, including the incorporation of structure, pleasant events, and mastery activities into her routine Therapist will provide Felicia [name] with strategies to regulate her emotions, including self-care, mindfulness, meditation, and relaxation exercises Therapist will help Felicia [name] to identify and disengage from maladaptive thought patterns using CBT-based strategies Therapist will provide Felicia [name] with opportunities to process her experiences in session Diagnosis Axis none 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 She was in her home in [place] and the therapist was in her office at  the time of the appointment. Felicia [name] had recorded an example of becoming frustrated with her friend, and was able to think of an example of a  time when she felt appreciated. Therapist encouraged her to continue noticing when she feels appreciated by others. Session focused on processing events in her last relationship. She is scheduled to be seen again in two weeks.  Objectives Related Problem: Felicia [name] experiences symptoms of depression related to difficulty connecting socially with others Description: Felicia [name] will increase her sense of well-being and connectedness and decrease symptoms of depression Target Date: 2023-05-04 Frequency: Biweekly Modality: individual Progress: 0% Planned Intervention: Therapist will provide referrals for additional resources as appropriate  Planned Intervention: Therapist will engage Felicia [name] in behavioral activation, including the incorporation of structure, pleasant events, and mastery activities into her routine  Planned Intervention: Therapist will help Felicia [name] to identify and disengage from maladaptive thought and behavior patterns using CBT-based strategies  Planned Intervention: Therapist will provide Felicia [name] with opportunities to process her experiences in session  Planned Intervention: Therapist will provide Felicia [name] with strategies to regulate her emotions, including self-care, mindfulness, meditation, and relaxation exercises  Treatment Plan Client Abilities/Strengths  Felicia [name] presents as motivated to improve her situation.  Client Treatment Preferences  None noted.  Client Statement of Needs  Felicia [name] is seeking CBT to manage symptoms of depression related to difficulty in social interactions.  Treatment Level  Biweekly  Symptoms  Autism: difficulty perceiving and responding to social cues, engaging in reciprocal conversation (Status: maintained). Depression: sleep irregularity, depressed mood, tearfulness, poor self confidence, feelings of loneliness (Status:  maintained).  Problems Addressed  New Description  Goals 1. Felicia [name] experiences symptoms of depression related to difficulty connecting socially with others Objective Felicia [name] will increase her sense of well-being and connectedness and decrease symptoms of depression Target Date: 2023-05-04 Frequency: Biweekly  Progress: 0 Modality: individual  Related Interventions Therapist will provide referrals for additional resources as appropriate Therapist will work with Felicia [name] to develop her social skills incorporating role-play exercises and video modeling, such as through the [employer] role play videos Therapist will engage Felicia [name] in behavioral activation, including the incorporation of structure, pleasant events, and mastery activities into her routine Therapist will provide Skila with strategies to regulate her emotions, including self-care, mindfulness, meditation, and relaxation exercises Therapist will help Anselma to identify and disengage from maladaptive thought patterns using CBT-based strategies Therapist will provide Marianna with opportunities to process her experiences in session Diagnosis Axis none 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

## 2022-11-20 DIAGNOSIS — K080 Exfoliation of teeth due to systemic causes: Secondary | ICD-10-CM | POA: Diagnosis not present

## 2022-11-24 DIAGNOSIS — R059 Cough, unspecified: Secondary | ICD-10-CM | POA: Diagnosis not present

## 2022-11-24 DIAGNOSIS — B370 Candidal stomatitis: Secondary | ICD-10-CM | POA: Diagnosis not present

## 2022-12-03 ENCOUNTER — Ambulatory Visit: Payer: BC Managed Care – PPO | Admitting: Clinical

## 2022-12-17 ENCOUNTER — Ambulatory Visit (INDEPENDENT_AMBULATORY_CARE_PROVIDER_SITE_OTHER): Payer: Medicare Other | Admitting: Clinical

## 2022-12-17 DIAGNOSIS — F331 Major depressive disorder, recurrent, moderate: Secondary | ICD-10-CM

## 2022-12-17 DIAGNOSIS — F840 Autistic disorder: Secondary | ICD-10-CM

## 2022-12-17 NOTE — Progress Notes (Addendum)
Diagnosis: F84.0, F33.1 Time: 11:01 am-11:54 am CPT: 16109U-04  Nani was seen remotely using secure video conferencing due to the COVID19 pandemic. She was in her home in [place] and the therapist was in her office at the time of the appointment. Client is aware of risks of telehealth and consented to a virtual visit. Alex had forgotten the session, so session began with therapist contacting Yessika to help her get the video started. Session focused on processing her recent trip to [place]. She also reflected upon several interactions online, and therapist cautioned her that one sounded like a scam and worked with her to consider how to stay safe. She created a plan to tell her parents. She is scheduled to be seen again in one month.  Shanisha's mother reached out to the therapist via secure message on 12/21/22. She reported that she has power of attorney over Zoi and indicated a plan to fax over POA documents. She reported that she had learned that Kristol had been trying to come up with $500 to pay someone she had met online who was threatening to release nude pictures of her and claiming to be underage. She assured the therapist that she has taken electronics and Arneda no longer has access to these sites. Therapist scheduled an emergency session for 12/24/22 to ensure safety and discuss next steps, and Timber's mother agreed to reach out if any further threats to safety occurred.   Objectives Related Problem: Kyerra experiences symptoms of depression related to difficulty connecting socially with others Description: Genieve will increase her sense of well-being and connectedness and decrease symptoms of depression Target Date: 2023-05-04 Frequency: Biweekly Modality: individual Progress: 0% Planned Intervention: Therapist will provide referrals for additional resources as appropriate  Planned Intervention: Therapist will engage Isola in behavioral activation, including the incorporation of structure,  pleasant events, and mastery activities into her routine  Planned Intervention: Therapist will help Sheena to identify and disengage from maladaptive thought and behavior patterns using CBT-based strategies  Planned Intervention: Therapist will provide Aleena with opportunities to process her experiences in session  Planned Intervention: Therapist will provide Akemi with strategies to regulate her emotions, including self-care, mindfulness, meditation, and relaxation exercises  Treatment Plan Client Abilities/Strengths  Jahzara presents as motivated to improve her situation.  Client Treatment Preferences  None noted.  Client Statement of Needs  Marleigh is seeking CBT to manage symptoms of depression related to difficulty in social interactions.  Treatment Level  Biweekly  Symptoms  Autism: difficulty perceiving and responding to social cues, engaging in reciprocal conversation (Status: maintained). Depression: sleep irregularity, depressed mood, tearfulness, poor self confidence, feelings of loneliness (Status: maintained).  Problems Addressed  New Description  Goals 1. Karyn experiences symptoms of depression related to difficulty connecting socially with others Objective Secily will increase her sense of well-being and connectedness and decrease symptoms of depression Target Date: 2023-05-04 Frequency: Biweekly  Progress: 0 Modality: individual  Related Interventions Therapist will provide referrals for additional resources as appropriate Therapist will work with Mashonda to develop her social skills incorporating role-play exercises and video modeling, such as through the [employer] role play videos Therapist will engage Alianny in behavioral activation, including the incorporation of structure, pleasant events, and mastery activities into her routine Therapist will provide Aloma with strategies to regulate her emotions, including self-care, mindfulness, meditation, and relaxation  exercises Therapist will help Michaeleen to identify and disengage from maladaptive thought patterns using CBT-based strategies Therapist will provide Mariska with opportunities to process her experiences  in session Diagnosis Axis none 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 She was in her home in [place] and the therapist was in her office at the time of the appointment. Tanice had recorded an example of becoming frustrated with her friend, and was able to think of an example of a time when she felt appreciated. Therapist encouraged her to continue noticing when she feels appreciated by others. Session focused on processing events in her last relationship. She is scheduled to be seen again in two weeks.  Objectives Related Problem: Koren experiences symptoms of depression related to difficulty connecting socially with others Description: Addylynn will increase her sense of well-being and connectedness and decrease symptoms of depression Target Date: 2023-05-04 Frequency: Biweekly Modality: individual Progress: 0% Planned Intervention: Therapist will provide referrals for additional resources as appropriate  Planned Intervention: Therapist will engage Lateka in behavioral activation, including the incorporation of structure, pleasant events, and mastery activities into her routine  Planned Intervention: Therapist will help Reyna to identify and disengage from maladaptive thought and behavior patterns using CBT-based strategies   Planned Intervention: Therapist will provide Santos with opportunities to process her experiences in session  Planned Intervention: Therapist will provide Shameria with strategies to regulate her emotions, including self-care, mindfulness, meditation, and relaxation exercises  Treatment Plan Client Abilities/Strengths  Kenadie presents as motivated to improve her situation.  Client Treatment Preferences  None noted.  Client Statement of Needs  Benny is seeking CBT to manage symptoms of depression related to difficulty in social interactions.  Treatment Level  Biweekly  Symptoms  Autism: difficulty perceiving and responding to social cues, engaging in reciprocal conversation (Status: maintained). Depression: sleep irregularity, depressed mood, tearfulness, poor self confidence, feelings of loneliness (Status: maintained).  Problems Addressed  New Description  Goals 1. Sharmeka experiences symptoms of depression related to difficulty connecting socially with others Objective Kristelle will increase her sense of well-being and connectedness and decrease symptoms of depression Target Date: 2023-05-04 Frequency: Biweekly  Progress: 0 Modality: individual  Related Interventions Therapist will provide referrals for additional resources as appropriate Therapist will work with Gennifer to develop her social skills incorporating role-play exercises and video modeling, such as through the [employer] role play videos Therapist will engage Jaonna in behavioral activation, including the incorporation of structure, pleasant events, and mastery activities into her routine Therapist will provide Nary with strategies to regulate her emotions, including self-care, mindfulness, meditation, and relaxation exercises Therapist will help Danielys to identify and disengage from maladaptive thought patterns using CBT-based strategies Therapist will provide Chrisanna with opportunities to process her experiences in  session Diagnosis Axis none 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

## 2022-12-22 DIAGNOSIS — F331 Major depressive disorder, recurrent, moderate: Secondary | ICD-10-CM | POA: Diagnosis not present

## 2022-12-22 DIAGNOSIS — Z0000 Encounter for general adult medical examination without abnormal findings: Secondary | ICD-10-CM | POA: Diagnosis not present

## 2022-12-22 DIAGNOSIS — J4520 Mild intermittent asthma, uncomplicated: Secondary | ICD-10-CM | POA: Diagnosis not present

## 2022-12-22 DIAGNOSIS — J309 Allergic rhinitis, unspecified: Secondary | ICD-10-CM | POA: Diagnosis not present

## 2022-12-22 DIAGNOSIS — E782 Mixed hyperlipidemia: Secondary | ICD-10-CM | POA: Diagnosis not present

## 2022-12-24 ENCOUNTER — Ambulatory Visit: Payer: Medicare Other | Admitting: Clinical

## 2023-01-07 ENCOUNTER — Ambulatory Visit: Payer: Medicare Other | Admitting: Clinical

## 2023-01-07 DIAGNOSIS — F840 Autistic disorder: Secondary | ICD-10-CM

## 2023-01-07 DIAGNOSIS — F331 Major depressive disorder, recurrent, moderate: Secondary | ICD-10-CM

## 2023-01-07 NOTE — Progress Notes (Signed)
Diagnosis: F84.0, F33.1 Time: 4:01 pm-4:57 pm CPT: 29562Z-30  Felicia [name] was seen in person for individual therapy. Her mother joined the beginning of session, and reported that Felicia [name] had her phone back, and they are working on Academic librarian then met with Felicia [name]. She processed her feelings about the incident, and therapist worked with her to consider red flags. She is scheduled to be seen again in one week.  Objectives Related Problem: Felicia [name] experiences symptoms of depression related to difficulty connecting socially with others Description: Felicia [name] will increase her sense of well-being and connectedness and decrease symptoms of depression Target Date: 2023-05-04 Frequency: Biweekly Modality: individual Progress: 0% Planned Intervention: Therapist will provide referrals for additional resources as appropriate  Planned Intervention: Therapist will engage Felicia [name] in behavioral activation, including the incorporation of structure, pleasant events, and mastery activities into her routine  Planned Intervention: Therapist will help Felicia [name] to identify and disengage from maladaptive thought and behavior patterns using CBT-based strategies  Planned Intervention: Therapist will provide Felicia [name] with opportunities to process her experiences in session  Planned Intervention: Therapist will provide Felicia [name] with strategies to regulate her emotions, including self-care, mindfulness, meditation, and relaxation exercises  Treatment Plan Client Abilities/Strengths  Felicia [name] presents as motivated to improve her situation.  Client Treatment Preferences  None noted.  Client Statement of Needs  Felicia [name] is seeking CBT to manage symptoms of depression related to difficulty in social interactions.  Treatment Level  Biweekly  Symptoms  Autism: difficulty perceiving and responding to social cues, engaging in reciprocal conversation (Status: maintained). Depression: sleep irregularity, depressed mood, tearfulness, poor  self confidence, feelings of loneliness (Status: maintained).  Problems Addressed  New Description  Goals 1. Felicia [name] experiences symptoms of depression related to difficulty connecting socially with others Objective Felicia [name] will increase her sense of well-being and connectedness and decrease symptoms of depression Target Date: 2023-05-04 Frequency: Biweekly  Progress: 0 Modality: individual  Related Interventions Therapist will provide referrals for additional resources as appropriate Therapist will work with Felicia [name] to develop her social skills incorporating role-play exercises and video modeling, such as through the [employer] role play videos Therapist will engage Felicia [name] in behavioral activation, including the incorporation of structure, pleasant events, and mastery activities into her routine Therapist will provide Felicia [name] with strategies to regulate her emotions, including self-care, mindfulness, meditation, and relaxation exercises Therapist will help Felicia [name] to identify and disengage from maladaptive thought patterns using CBT-based strategies Therapist will provide Felicia [name] with opportunities to process her experiences in session Diagnosis Axis none 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 She was in her home in [place] and the therapist was in her office at the time of the appointment. Felicia [name] had recorded an example of becoming frustrated with her friend, and was able to think of an example of a time when she felt  appreciated. Therapist encouraged her to continue noticing when she feels appreciated by others. Session focused on processing events in her  last relationship. She is scheduled to be seen again in two weeks.  Objectives Related Problem: Felicia [name] experiences symptoms of depression related to difficulty connecting socially with others Description: Felicia [name] will increase her sense of well-being and connectedness and decrease symptoms of depression Target Date: 2023-05-04 Frequency: Biweekly Modality: individual Progress: 0% Planned Intervention: Therapist will provide referrals for additional resources as appropriate  Planned Intervention: Therapist will engage Felicia [name] in behavioral activation, including the incorporation of structure, pleasant events, and mastery activities into her routine  Planned Intervention: Therapist will help Felicia [name] to identify and disengage from maladaptive thought and behavior patterns using CBT-based strategies  Planned Intervention: Therapist will provide Felicia [name] with opportunities to process her experiences in session  Planned Intervention: Therapist will provide Felicia [name] with strategies to regulate her emotions, including self-care, mindfulness, meditation, and relaxation exercises  Treatment Plan Client Abilities/Strengths  Felicia [name] presents as motivated to improve her situation.  Client Treatment Preferences  None noted.  Client Statement of Needs  Felicia [name] is seeking CBT to manage symptoms of depression related to difficulty in social interactions.  Treatment Level  Biweekly  Symptoms  Autism: difficulty perceiving and responding to social cues, engaging in reciprocal conversation (Status: maintained). Depression: sleep irregularity, depressed mood, tearfulness, poor self confidence, feelings of loneliness (Status: maintained).  Problems Addressed  New Description  Goals 1. Felicia [name] experiences symptoms of depression related to difficulty connecting socially with  others Objective Felicia [name] will increase her sense of well-being and connectedness and decrease symptoms of depression Target Date: 2023-05-04 Frequency: Biweekly  Progress: 0 Modality: individual  Related Interventions Therapist will provide referrals for additional resources as appropriate Therapist will work with Felicia [name] to develop her social skills incorporating role-play exercises and video modeling, such as through the [employer] role play videos Therapist will engage Felicia [name] in behavioral activation, including the incorporation of structure, pleasant events, and mastery activities into her routine Therapist will provide Felicia [name] with strategies to regulate her emotions, including self-care, mindfulness, meditation, and relaxation exercises Therapist will help Felicia [name] to identify and disengage from maladaptive thought patterns using CBT-based strategies Therapist will provide Felicia [name] with opportunities to process her experiences in session Diagnosis Axis none 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

## 2023-01-14 ENCOUNTER — Ambulatory Visit (INDEPENDENT_AMBULATORY_CARE_PROVIDER_SITE_OTHER): Payer: Medicare Other | Admitting: Clinical

## 2023-01-14 DIAGNOSIS — F840 Autistic disorder: Secondary | ICD-10-CM | POA: Diagnosis not present

## 2023-01-14 DIAGNOSIS — F331 Major depressive disorder, recurrent, moderate: Secondary | ICD-10-CM | POA: Diagnosis not present

## 2023-01-14 NOTE — Progress Notes (Signed)
Diagnosis: F84.0, F33.1 Time: 11:01 am-11:56 am CPT: 90837P  Keliyah was seen in person for individual therapy. Session focused on continuing to process recent events, including her relationships with her parents and friends. Therapist pointed out cognitive distortions, engaged her in evidence testing, and worked with her to consider alternate perspectives. She is scheduled to be seen again in one week.  Objectives Related Problem: Telisa experiences symptoms of depression related to difficulty connecting socially with others Description: Ezrah will increase her sense of well-being and connectedness and decrease symptoms of depression Target Date: 2023-05-04 Frequency: Biweekly Modality: individual Progress: 0% Planned Intervention: Therapist will provide referrals for additional resources as appropriate  Planned Intervention: Therapist will engage Jamelah in behavioral activation, including the incorporation of structure, pleasant events, and mastery activities into her routine  Planned Intervention: Therapist will help Breah to identify and disengage from maladaptive thought and behavior patterns using CBT-based strategies  Planned Intervention: Therapist will provide Rebecah with opportunities to process her experiences in session  Planned Intervention: Therapist will provide Shellsea with strategies to regulate her emotions, including self-care, mindfulness, meditation, and relaxation exercises  Treatment Plan Client Abilities/Strengths  Ashiah presents as motivated to improve her situation.  Client Treatment Preferences  None noted.  Client Statement of Needs  Shaylinn is seeking CBT to manage symptoms of depression related to difficulty in social interactions.  Treatment Level  Biweekly  Symptoms  Autism: difficulty perceiving and responding to social cues, engaging in reciprocal conversation (Status: maintained). Depression: sleep irregularity, depressed mood, tearfulness, poor self confidence,  feelings of loneliness (Status: maintained).  Problems Addressed  New Description  Goals 1. Vernecia experiences symptoms of depression related to difficulty connecting socially with others Objective Paidyn will increase her sense of well-being and connectedness and decrease symptoms of depression Target Date: 2023-05-04 Frequency: Biweekly  Progress: 0 Modality: individual  Related Interventions Therapist will provide referrals for additional resources as appropriate Therapist will work with Mavis to develop her social skills incorporating role-play exercises and video modeling, such as through the [employer] role play videos Therapist will engage Sophronia in behavioral activation, including the incorporation of structure, pleasant events, and mastery activities into her routine Therapist will provide Aileana with strategies to regulate her emotions, including self-care, mindfulness, meditation, and relaxation exercises Therapist will help Jaice to identify and disengage from maladaptive thought patterns using CBT-based strategies Therapist will provide Ilya with opportunities to process her experiences in session Diagnosis Axis none 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 She was in her home in [place] and the therapist was in her office at the time of the appointment. Viona had recorded an example of becoming frustrated with her friend, and was able to think of an example of a time when she felt appreciated. Therapist  encouraged her to continue noticing when she feels appreciated by others. Session focused on processing events in her last relationship. She is scheduled to be seen again  in two weeks.  Objectives Related Problem: Soma experiences symptoms of depression related to difficulty connecting socially with others Description: Weda will increase her sense of well-being and connectedness and decrease symptoms of depression Target Date: 2023-05-04 Frequency: Biweekly Modality: individual Progress: 0% Planned Intervention: Therapist will provide referrals for additional resources as appropriate  Planned Intervention: Therapist will engage Kimya in behavioral activation, including the incorporation of structure, pleasant events, and mastery activities into her routine  Planned Intervention: Therapist will help Maleeka to identify and disengage from maladaptive thought and behavior patterns using CBT-based strategies  Planned Intervention: Therapist will provide Keyani with opportunities to process her experiences in session  Planned Intervention: Therapist will provide Regla with strategies to regulate her emotions, including self-care, mindfulness, meditation, and relaxation exercises  Treatment Plan Client Abilities/Strengths  Jakera presents as motivated to improve her situation.  Client Treatment Preferences  None noted.  Client Statement of Needs  Gisel is seeking CBT to manage symptoms of depression related to difficulty in social interactions.  Treatment Level  Biweekly  Symptoms  Autism: difficulty perceiving and responding to social cues, engaging in reciprocal conversation (Status: maintained). Depression: sleep irregularity, depressed mood, tearfulness, poor self confidence, feelings of loneliness (Status: maintained).  Problems Addressed  New Description  Goals 1. Rileyann experiences symptoms of depression related to difficulty connecting socially with others Objective Hagar will increase  her sense of well-being and connectedness and decrease symptoms of depression Target Date: 2023-05-04 Frequency: Biweekly  Progress: 0 Modality: individual  Related Interventions Therapist will provide referrals for additional resources as appropriate Therapist will work with Phallon to develop her social skills incorporating role-play exercises and video modeling, such as through the [employer] role play videos Therapist will engage Aarianna in behavioral activation, including the incorporation of structure, pleasant events, and mastery activities into her routine Therapist will provide Salvatrice with strategies to regulate her emotions, including self-care, mindfulness, meditation, and relaxation exercises Therapist will help Alishba to identify and disengage from maladaptive thought patterns using CBT-based strategies Therapist will provide Chassie with opportunities to process her experiences in session Diagnosis Axis none 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

## 2023-01-21 ENCOUNTER — Ambulatory Visit: Payer: Medicare Other | Admitting: Clinical

## 2023-01-21 DIAGNOSIS — F840 Autistic disorder: Secondary | ICD-10-CM

## 2023-01-21 NOTE — Progress Notes (Signed)
Diagnosis: F84.0, F33.1 Time: 12:01 pm-12:56 pm CPT: 90837P  Felicia [name] was seen in person for individual therapy. Her mother joined the beginning of session, and shared that she felt proud of Felicia [name] for clearly communicating and upholding a boundary with a friend who is interested in her romantically. Session focused on processing this, with the therapist engaging Felicia [name] in a discussion of red flags and how to uphold the boundary going forward. She is scheduled to be seen again in three weeks.  Objectives Related Problem: Felicia [name] experiences symptoms of depression related to difficulty connecting socially with others Description: Felicia [name] will increase her sense of well-being and connectedness and decrease symptoms of depression Target Date: 2023-05-04 Frequency: Biweekly Modality: individual Progress: 0% Planned Intervention: Therapist will provide referrals for additional resources as appropriate  Planned Intervention: Therapist will engage Felicia [name] in behavioral activation, including the incorporation of structure, pleasant events, and mastery activities into her routine  Planned Intervention: Therapist will help Felicia [name] to identify and disengage from maladaptive thought and behavior patterns using CBT-based strategies  Planned Intervention: Therapist will provide Felicia [name] with opportunities to process her experiences in session  Planned Intervention: Therapist will provide Felicia [name] with strategies to regulate her emotions, including self-care, mindfulness, meditation, and relaxation exercises  Treatment Plan Client Abilities/Strengths  Felicia [name] presents as motivated to improve her situation.  Client Treatment Preferences  None noted.  Client Statement of Needs  Felicia [name] is seeking CBT to manage symptoms of depression related to difficulty in social interactions.  Treatment Level  Biweekly  Symptoms  Autism: difficulty perceiving and responding to social cues, engaging in reciprocal conversation (Status:  maintained). Depression: sleep irregularity, depressed mood, tearfulness, poor self confidence, feelings of loneliness (Status: maintained).  Problems Addressed  New Description  Goals 1. Kimari experiences symptoms of depression related to difficulty connecting socially with others Objective Felicia [name] will increase her sense of well-being and connectedness and decrease symptoms of depression Target Date: 2023-05-04 Frequency: Biweekly  Progress: 0 Modality: individual  Related Interventions Therapist will provide referrals for additional resources as appropriate Therapist will work with Kimmora to develop her social skills incorporating role-play exercises and video modeling, such as through the [employer] role play videos Therapist will engage Zamaya in behavioral activation, including the incorporation of structure, pleasant events, and mastery activities into her routine Therapist will provide Demecia with strategies to regulate her emotions, including self-care, mindfulness, meditation, and relaxation exercises Therapist will help Elody to identify and disengage from maladaptive thought patterns using CBT-based strategies Therapist will provide Rickelle with opportunities to process her experiences in session Diagnosis Axis none 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 She was in her home in [name] and the therapist was in her office at the time of the appointment. Felicia [name] had recorded an example of becoming frustrated with  her friend, and was able to think of an example of a time when she felt appreciated. Therapist encouraged her to continue noticing when  she feels appreciated by others. Session focused on processing events in her last relationship. She is scheduled to be seen again in two weeks.  Objectives Related Problem: Felicia [name] experiences symptoms of depression related to difficulty connecting socially with others Description: Felicia [name] will increase her sense of well-being and connectedness and decrease symptoms of depression Target Date: 2023-05-04 Frequency: Biweekly Modality: individual Progress: 0% Planned Intervention: Therapist will provide referrals for additional resources as appropriate  Planned Intervention: Therapist will engage Felicia [name] in behavioral activation, including the incorporation of structure, pleasant events, and mastery activities into her routine  Planned Intervention: Therapist will help Felicia [name] to identify and disengage from maladaptive thought and behavior patterns using CBT-based strategies  Planned Intervention: Therapist will provide Felicia [name] with opportunities to process her experiences in session  Planned Intervention: Therapist will provide Felicia [name] with strategies to regulate her emotions, including self-care, mindfulness, meditation, and relaxation exercises  Treatment Plan Client Abilities/Strengths  Felicia [name] presents as motivated to improve her situation.  Client Treatment Preferences  None noted.  Client Statement of Needs  Felicia [name] is seeking CBT to manage symptoms of depression related to difficulty in social interactions.  Treatment Level  Biweekly  Symptoms  Autism: difficulty perceiving and responding to social cues, engaging in reciprocal conversation (Status: maintained). Depression: sleep irregularity, depressed mood, tearfulness, poor self confidence, feelings of loneliness (Status: maintained).  Problems Addressed  New Description  Goals 1. Felicia [name] experiences symptoms of  depression related to difficulty connecting socially with others Objective Felicia [name] will increase her sense of well-being and connectedness and decrease symptoms of depression Target Date: 2023-05-04 Frequency: Biweekly  Progress: 0 Modality: individual  Related Interventions Therapist will provide referrals for additional resources as appropriate Therapist will work with Felicia [name] to develop her social skills incorporating role-play exercises and video modeling, such as through the [employer] role play videos Therapist will engage Marisela in behavioral activation, including the incorporation of structure, pleasant events, and mastery activities into her routine Therapist will provide Felicia [name] with strategies to regulate her emotions, including self-care, mindfulness, meditation, and relaxation exercises Therapist will help Kamron to identify and disengage from maladaptive thought patterns using CBT-based strategies Therapist will provide Sherelle with opportunities to process her experiences in session Diagnosis Axis none 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

## 2023-01-26 DIAGNOSIS — F900 Attention-deficit hyperactivity disorder, predominantly inattentive type: Secondary | ICD-10-CM | POA: Diagnosis not present

## 2023-01-26 DIAGNOSIS — F419 Anxiety disorder, unspecified: Secondary | ICD-10-CM | POA: Diagnosis not present

## 2023-01-26 DIAGNOSIS — F3489 Other specified persistent mood disorders: Secondary | ICD-10-CM | POA: Diagnosis not present

## 2023-01-28 ENCOUNTER — Ambulatory Visit: Payer: Medicare Other | Admitting: Clinical

## 2023-02-11 ENCOUNTER — Ambulatory Visit (INDEPENDENT_AMBULATORY_CARE_PROVIDER_SITE_OTHER): Payer: Medicare Other | Admitting: Clinical

## 2023-02-11 DIAGNOSIS — F840 Autistic disorder: Secondary | ICD-10-CM

## 2023-02-11 DIAGNOSIS — F331 Major depressive disorder, recurrent, moderate: Secondary | ICD-10-CM | POA: Diagnosis not present

## 2023-02-11 NOTE — Progress Notes (Signed)
Diagnosis: F84.0, F33.1 Time: 12:01 pm-12:56 pm CPT: 90837P  Berry was seen in person for individual therapy. Her mother joined at her request. They shared improvements in recent weeks, but a continued sense of mistrust and difficulty with communication. Vinette shared that she often feels misunderstood in the relationship, and her mother reflected upon her fears for Jury, especially in terms of the future. For homework, they will both reflect upon their goals for their relationship. They are scheduled to be seen again in two weeks.  Objectives Related Problem: Vear experiences symptoms of depression related to difficulty connecting socially with others Description: Jadwiga will increase her sense of well-being and connectedness and decrease symptoms of depression Target Date: 2023-05-04 Frequency: Biweekly Modality: individual Progress: 0% Planned Intervention: Therapist will provide referrals for additional resources as appropriate  Planned Intervention: Therapist will engage Aarohi in behavioral activation, including the incorporation of structure, pleasant events, and mastery activities into her routine  Planned Intervention: Therapist will help Mikinzie to identify and disengage from maladaptive thought and behavior patterns using CBT-based strategies  Planned Intervention: Therapist will provide Zanetta with opportunities to process her experiences in session  Planned Intervention: Therapist will provide Haliyah with strategies to regulate her emotions, including self-care, mindfulness, meditation, and relaxation exercises  Treatment Plan Client Abilities/Strengths  Cathlin presents as motivated to improve her situation.  Client Treatment Preferences  None noted.  Client Statement of Needs  Aletta is seeking CBT to manage symptoms of depression related to difficulty in social interactions.  Treatment Level  Biweekly  Symptoms  Autism: difficulty perceiving and responding to social cues, engaging  in reciprocal conversation (Status: maintained). Depression: sleep irregularity, depressed mood, tearfulness, poor self confidence, feelings of loneliness (Status: maintained).  Problems Addressed  New Description  Goals 1. Sherryl experiences symptoms of depression related to difficulty connecting socially with others Objective Shamonica will increase her sense of well-being and connectedness and decrease symptoms of depression Target Date: 2023-05-04 Frequency: Biweekly  Progress: 0 Modality: individual  Related Interventions Therapist will provide referrals for additional resources as appropriate Therapist will work with Marillyn to develop her social skills incorporating role-play exercises and video modeling, such as through the [employer] role play videos Therapist will engage Nesta in behavioral activation, including the incorporation of structure, pleasant events, and mastery activities into her routine Therapist will provide Leana with strategies to regulate her emotions, including self-care, mindfulness, meditation, and relaxation exercises Therapist will help Shonteria to identify and disengage from maladaptive thought patterns using CBT-based strategies Therapist will provide Bailey with opportunities to process her experiences in session Diagnosis Axis none 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 She was in her home in [place] and the therapist was in her office at the time of the appointment. [name] had recorded an  example of becoming frustrated with her friend, and was able to think of an example of a time when she felt appreciated. Therapist encouraged her  to continue noticing when she feels appreciated by others. Session focused on processing events in her last relationship. She is scheduled to be seen again in two weeks.  Objectives Related Problem: Karem experiences symptoms of depression related to difficulty connecting socially with others Description: Zell will increase her sense of well-being and connectedness and decrease symptoms of depression Target Date: 2023-05-04 Frequency: Biweekly Modality: individual Progress: 0% Planned Intervention: Therapist will provide referrals for additional resources as appropriate  Planned Intervention: Therapist will engage Shelbi in behavioral activation, including the incorporation of structure, pleasant events, and mastery activities into her routine  Planned Intervention: Therapist will help Emilya to identify and disengage from maladaptive thought and behavior patterns using CBT-based strategies  Planned Intervention: Therapist will provide Cumi with opportunities to process her experiences in session  Planned Intervention: Therapist will provide Brynlea with strategies to regulate her emotions, including self-care, mindfulness, meditation, and relaxation exercises  Treatment Plan Client Abilities/Strengths  Milana presents as motivated to improve her situation.  Client Treatment Preferences  None noted.  Client Statement of Needs  Gerline is seeking CBT to manage symptoms of depression related to difficulty in social interactions.  Treatment Level  Biweekly  Symptoms  Autism: difficulty perceiving and responding to social cues, engaging in reciprocal conversation (Status: maintained). Depression: sleep irregularity, depressed mood, tearfulness, poor self confidence, feelings of loneliness (Status: maintained).  Problems Addressed  New Description   Goals 1. Geena experiences symptoms of depression related to difficulty connecting socially with others Objective Billye will increase her sense of well-being and connectedness and decrease symptoms of depression Target Date: 2023-05-04 Frequency: Biweekly  Progress: 0 Modality: individual  Related Interventions Therapist will provide referrals for additional resources as appropriate Therapist will work with Lenzie to develop her social skills incorporating role-play exercises and video modeling, such as through the [employer] role play videos Therapist will engage Paiten in behavioral activation, including the incorporation of structure, pleasant events, and mastery activities into her routine Therapist will provide Danaya with strategies to regulate her emotions, including self-care, mindfulness, meditation, and relaxation exercises Therapist will help Tempie to identify and disengage from maladaptive thought patterns using CBT-based strategies Therapist will provide Kellyann with opportunities to process her experiences in session Diagnosis Axis none 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

## 2023-02-13 DIAGNOSIS — R000 Tachycardia, unspecified: Secondary | ICD-10-CM | POA: Diagnosis not present

## 2023-02-13 DIAGNOSIS — F339 Major depressive disorder, recurrent, unspecified: Secondary | ICD-10-CM | POA: Diagnosis not present

## 2023-02-13 DIAGNOSIS — R0789 Other chest pain: Secondary | ICD-10-CM | POA: Diagnosis not present

## 2023-02-25 ENCOUNTER — Ambulatory Visit (INDEPENDENT_AMBULATORY_CARE_PROVIDER_SITE_OTHER): Payer: Medicare Other | Admitting: Clinical

## 2023-02-25 DIAGNOSIS — F840 Autistic disorder: Secondary | ICD-10-CM

## 2023-02-25 DIAGNOSIS — F331 Major depressive disorder, recurrent, moderate: Secondary | ICD-10-CM | POA: Diagnosis not present

## 2023-02-25 NOTE — Progress Notes (Signed)
Diagnosis: F84.0, F33.1 Time: 11:01 am-11:59 am CPT: 90837P  Aleane was seen in person for individual therapy. She reported continued improvement in their relationship following her most recent session, sharing that she felt she had gotten out emotions that had built up. Session focused on her perceived tendency to be targeted by others for conflict, both in person and on social media. Therapist engaged her in discussion of how to set boundaries on social media, including turning off comments and staying out of interactions with people who have escalated toward her in the past. She is scheduled to be seen again in two weeks.  Objectives Related Problem: Kanija experiences symptoms of depression related to difficulty connecting socially with others Description: Levada will increase her sense of well-being and connectedness and decrease symptoms of depression Target Date: 2023-05-04 Frequency: Biweekly Modality: individual Progress: 0% Planned Intervention: Therapist will provide referrals for additional resources as appropriate  Planned Intervention: Therapist will engage Chevi in behavioral activation, including the incorporation of structure, pleasant events, and mastery activities into her routine  Planned Intervention: Therapist will help Takeila to identify and disengage from maladaptive thought and behavior patterns using CBT-based strategies  Planned Intervention: Therapist will provide Eulalah with opportunities to process her experiences in session  Planned Intervention: Therapist will provide Hildreth with strategies to regulate her emotions, including self-care, mindfulness, meditation, and relaxation exercises  Treatment Plan Client Abilities/Strengths  Jeni presents as motivated to improve her situation.  Client Treatment Preferences  None noted.  Client Statement of Needs  Daci is seeking CBT to manage symptoms of depression related to difficulty in social interactions.  Treatment Level   Biweekly  Symptoms  Autism: difficulty perceiving and responding to social cues, engaging in reciprocal conversation (Status: maintained). Depression: sleep irregularity, depressed mood, tearfulness, poor self confidence, feelings of loneliness (Status: maintained).  Problems Addressed  New Description  Goals 1. Elisea experiences symptoms of depression related to difficulty connecting socially with others Objective Veneda will increase her sense of well-being and connectedness and decrease symptoms of depression Target Date: 2023-05-04 Frequency: Biweekly  Progress: 0 Modality: individual  Related Interventions Therapist will provide referrals for additional resources as appropriate Therapist will work with Jennye to develop her social skills incorporating role-play exercises and video modeling, such as through the [employer] role play videos Therapist will engage Kiasha in behavioral activation, including the incorporation of structure, pleasant events, and mastery activities into her routine Therapist will provide Tanisha with strategies to regulate her emotions, including self-care, mindfulness, meditation, and relaxation exercises Therapist will help Akeela to identify and disengage from maladaptive thought patterns using CBT-based strategies Therapist will provide Bette with opportunities to process her experiences in session Diagnosis Axis none 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 She was in her home in [place] and the therapist was in her office at the time of the appointment. [name] had recorded an example of becoming frustrated with her friend,  and was able to think of an example of a time when she felt appreciated. Therapist encouraged her to continue noticing when she feels appreciated by others. Session focused on processing events in her last relationship. She is scheduled to be seen again in two weeks.  Objectives Related Problem: Misti experiences symptoms of depression related to difficulty connecting socially with others Description: Nyima will increase her sense of well-being and connectedness and decrease symptoms of depression Target Date: 2023-05-04 Frequency: Biweekly Modality: individual Progress: 0% Planned Intervention: Therapist will provide referrals for additional resources as appropriate  Planned Intervention: Therapist will engage Dannya in behavioral activation, including the incorporation of structure, pleasant events, and mastery activities into her routine  Planned Intervention: Therapist will help Haille to identify and disengage from maladaptive thought and behavior patterns using CBT-based strategies  Planned Intervention: Therapist will provide Fleda with opportunities to process her experiences in session  Planned Intervention: Therapist will provide Ronan with strategies to regulate her emotions, including self-care, mindfulness, meditation, and relaxation exercises  Treatment Plan Client Abilities/Strengths  Maxcine presents as motivated to improve her situation.  Client Treatment Preferences  None noted.  Client Statement of Needs  Cameka is seeking CBT to manage symptoms of depression related to difficulty in social interactions.  Treatment Level  Biweekly  Symptoms  Autism: difficulty perceiving and responding to social cues, engaging in reciprocal conversation (Status: maintained). Depression: sleep irregularity, depressed mood, tearfulness, poor self  confidence, feelings of loneliness (Status: maintained).  Problems Addressed  New Description  Goals 1. Jashay experiences symptoms of depression related to difficulty connecting socially with others Objective Carrieann will increase her sense of well-being and connectedness and decrease symptoms of depression Target Date: 2023-05-04 Frequency: Biweekly  Progress: 0 Modality: individual  Related Interventions Therapist will provide referrals for additional resources as appropriate Therapist will work with Reyanna to develop her social skills incorporating role-play exercises and video modeling, such as through the [employer] role play videos Therapist will engage Renaye in behavioral activation, including the incorporation of structure, pleasant events, and mastery activities into her routine Therapist will provide Belvia with strategies to regulate her emotions, including self-care, mindfulness, meditation, and relaxation exercises Therapist will help Paris to identify and disengage from maladaptive thought patterns using CBT-based strategies Therapist will provide Raahi with opportunities to process her experiences in session Diagnosis Axis none 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

## 2023-03-11 ENCOUNTER — Ambulatory Visit (INDEPENDENT_AMBULATORY_CARE_PROVIDER_SITE_OTHER): Payer: Medicare Other | Admitting: Clinical

## 2023-03-11 DIAGNOSIS — F840 Autistic disorder: Secondary | ICD-10-CM

## 2023-03-11 DIAGNOSIS — F331 Major depressive disorder, recurrent, moderate: Secondary | ICD-10-CM | POA: Diagnosis not present

## 2023-03-11 NOTE — Progress Notes (Signed)
Diagnosis: F84.0, F33.1 Time: 11:01 am-11:58 am CPT: 90837P  Leidi was seen in person for individual therapy. She reported continued improvement in her relationship with her parents. Session focused on an episode she had experienced over the weekend during which she had become shaky and fatigued. Therapist encouraged her to discuss with her PCP and to track symptoms in order to help with understanding. She also reflected upon stress she has experienced in her friendships. Therapist encouraged her to consider what she can do to relax, and she indicated a plan to bake. She is scheduled to be seen again in two weeks.  Objectives Related Problem: Shirleyann experiences symptoms of depression related to difficulty connecting socially with others Description: Nalia will increase her sense of well-being and connectedness and decrease symptoms of depression Target Date: 2023-05-04 Frequency: Biweekly Modality: individual Progress: 0% Planned Intervention: Therapist will provide referrals for additional resources as appropriate  Planned Intervention: Therapist will engage Cincere in behavioral activation, including the incorporation of structure, pleasant events, and mastery activities into her routine  Planned Intervention: Therapist will help Kenzli to identify and disengage from maladaptive thought and behavior patterns using CBT-based strategies  Planned Intervention: Therapist will provide Xochil with opportunities to process her experiences in session  Planned Intervention: Therapist will provide Tomara with strategies to regulate her emotions, including self-care, mindfulness, meditation, and relaxation exercises  Treatment Plan Client Abilities/Strengths  Nancyjo presents as motivated to improve her situation.  Client Treatment Preferences  None noted.  Client Statement of Needs  Navina is seeking CBT to manage symptoms of depression related to difficulty in social interactions.  Treatment Level   Biweekly  Symptoms  Autism: difficulty perceiving and responding to social cues, engaging in reciprocal conversation (Status: maintained). Depression: sleep irregularity, depressed mood, tearfulness, poor self confidence, feelings of loneliness (Status: maintained).  Problems Addressed  New Description  Goals 1. Latanga experiences symptoms of depression related to difficulty connecting socially with others Objective Ellenore will increase her sense of well-being and connectedness and decrease symptoms of depression Target Date: 2023-05-04 Frequency: Biweekly  Progress: 0 Modality: individual  Related Interventions Therapist will provide referrals for additional resources as appropriate Therapist will work with Riely to develop her social skills incorporating role-play exercises and video modeling, such as through the [employer] role play videos Therapist will engage Nalayah in behavioral activation, including the incorporation of structure, pleasant events, and mastery activities into her routine Therapist will provide Melitta with strategies to regulate her emotions, including self-care, mindfulness, meditation, and relaxation exercises Therapist will help Denna to identify and disengage from maladaptive thought patterns using CBT-based strategies Therapist will provide Jericho with opportunities to process her experiences in session Diagnosis Axis none 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 She was in her home in [place] and the therapist was in her office at the time of the appointment. Taela had recorded an example of becoming frustrated with her friend, and  was able to think of an example of a time when she felt appreciated. Therapist encouraged her to continue noticing when she feels appreciated by others. Session focused on processing events in her last relationship. She is scheduled to be seen again in two weeks.  Objectives Related Problem: Letishia experiences symptoms of depression related to difficulty connecting socially with others Description: Martin will increase her sense of well-being and connectedness and decrease symptoms of depression Target Date: 2023-05-04 Frequency: Biweekly Modality: individual Progress: 0% Planned Intervention: Therapist will provide referrals for additional resources as appropriate  Planned Intervention: Therapist will engage Shannen in behavioral activation, including the incorporation of structure, pleasant events, and mastery activities into her routine  Planned Intervention: Therapist will help Ahmaya to identify and disengage from maladaptive thought and behavior patterns using CBT-based strategies  Planned Intervention: Therapist will provide Sue with opportunities to process her experiences in session  Planned Intervention: Therapist will provide Sheneika with strategies to regulate her emotions, including self-care, mindfulness, meditation, and relaxation exercises  Treatment Plan Client Abilities/Strengths  Rowyn presents as motivated to improve her situation.  Client Treatment Preferences  None noted.  Client Statement of Needs  Avin is seeking CBT to manage symptoms of depression related to difficulty in social interactions.  Treatment Level  Biweekly  Symptoms  Autism: difficulty perceiving and responding to social cues, engaging in reciprocal conversation (Status: maintained). Depression: sleep irregularity, depressed mood, tearfulness, poor self  confidence, feelings of loneliness (Status: maintained).  Problems Addressed  New Description  Goals 1. Eartha experiences symptoms of depression related to difficulty connecting socially with others Objective Loann will increase her sense of well-being and connectedness and decrease symptoms of depression Target Date: 2023-05-04 Frequency: Biweekly  Progress: 0 Modality: individual  Related Interventions Therapist will provide referrals for additional resources as appropriate Therapist will work with Traniya to develop her social skills incorporating role-play exercises and video modeling, such as through the [employer] role play videos Therapist will engage Chloe in behavioral activation, including the incorporation of structure, pleasant events, and mastery activities into her routine Therapist will provide Carnelia with strategies to regulate her emotions, including self-care, mindfulness, meditation, and relaxation exercises Therapist will help Johnnye to identify and disengage from maladaptive thought patterns using CBT-based strategies Therapist will provide Mykael with opportunities to process her experiences in session Diagnosis Axis none 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

## 2023-03-25 ENCOUNTER — Ambulatory Visit (INDEPENDENT_AMBULATORY_CARE_PROVIDER_SITE_OTHER): Payer: Medicare Other | Admitting: Clinical

## 2023-03-25 DIAGNOSIS — F331 Major depressive disorder, recurrent, moderate: Secondary | ICD-10-CM

## 2023-03-25 DIAGNOSIS — F840 Autistic disorder: Secondary | ICD-10-CM | POA: Diagnosis not present

## 2023-03-25 NOTE — Progress Notes (Signed)
Diagnosis: F84.0, F33.1 Time: 11:01 am-11:58 am CPT: 90837P  Felicia [name] was seen in person for individual therapy. Session focused on dynamics that had arisen in her choir and her friendships. Therapist suggested communication strategies and provided an opportunity to process experiences. She is scheduled to be seen again in two weeks.  Objectives Related Problem: Felicia [name] experiences symptoms of depression related to difficulty connecting socially with others Description: Felicia [name] will increase her sense of well-being and connectedness and decrease symptoms of depression Target Date: 2023-05-04 Frequency: Biweekly Modality: individual Progress: 0% Planned Intervention: Therapist will provide referrals for additional resources as appropriate  Planned Intervention: Therapist will engage Felicia [name] in behavioral activation, including the incorporation of structure, pleasant events, and mastery activities into her routine  Planned Intervention: Therapist will help Felicia [name] to identify and disengage from maladaptive thought and behavior patterns using CBT-based strategies  Planned Intervention: Therapist will provide Felicia [name] with opportunities to process her experiences in session  Planned Intervention: Therapist will provide Felicia [name] with strategies to regulate her emotions, including self-care, mindfulness, meditation, and relaxation exercises  Treatment Plan Client Abilities/Strengths  Felicia [name] presents as motivated to improve her situation.  Client Treatment Preferences  None noted.  Client Statement of Needs  Felicia [name] is seeking CBT to manage symptoms of depression related to difficulty in social interactions.  Treatment Level  Biweekly  Symptoms  Autism: difficulty perceiving and responding to social cues, engaging in reciprocal conversation (Status: maintained). Depression: sleep irregularity, depressed mood, tearfulness, poor self confidence, feelings of loneliness (Status: maintained).  Problems Addressed  New  Description  Goals 1. Felicia [name] experiences symptoms of depression related to difficulty connecting socially with others Objective Felicia [name] will increase her sense of well-being and connectedness and decrease symptoms of depression Target Date: 2023-05-04 Frequency: Biweekly  Progress: 0 Modality: individual  Related Interventions Therapist will provide referrals for additional resources as appropriate Therapist will work with Felicia [name] to develop her social skills incorporating role-play exercises and video modeling, such as through the [employer] role play videos Therapist will engage Felicia [name] in behavioral activation, including the incorporation of structure, pleasant events, and mastery activities into her routine Therapist will provide Felicia [name] with strategies to regulate her emotions, including self-care, mindfulness, meditation, and relaxation exercises Therapist will help Felicia [name] to identify and disengage from maladaptive thought patterns using CBT-based strategies Therapist will provide Felicia [name] with opportunities to process her experiences in session Diagnosis Axis none 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 She was in her home in [name] and the therapist was in her office at the time of the appointment. Felicia [name] had recorded an example of becoming frustrated with her friend, and was able to think of an example of a time when she felt appreciated. Therapist encouraged her to continue noticing when she feels appreciated by  others. Session focused on processing events in her last relationship. She is scheduled to be seen again in two weeks.  Objectives Related Problem: Felicia [name] experiences symptoms  of depression related to difficulty connecting socially with others Description: Felicia [name] will increase her sense of well-being and connectedness and decrease symptoms of depression Target Date: 2023-05-04 Frequency: Biweekly Modality: individual Progress: 0% Planned Intervention: Therapist will provide referrals for additional resources as appropriate  Planned Intervention: Therapist will engage Felicia [name] in behavioral activation, including the incorporation of structure, pleasant events, and mastery activities into her routine  Planned Intervention: Therapist will help Felicia [name] to identify and disengage from maladaptive thought and behavior patterns using CBT-based strategies  Planned Intervention: Therapist will provide Felicia [name] with opportunities to process her experiences in session  Planned Intervention: Therapist will provide Felicia [name] with strategies to regulate her emotions, including self-care, mindfulness, meditation, and relaxation exercises  Treatment Plan Client Abilities/Strengths  Felicia [name] presents as motivated to improve her situation.  Client Treatment Preferences  None noted.  Client Statement of Needs  Felicia [name] is seeking CBT to manage symptoms of depression related to difficulty in social interactions.  Treatment Level  Biweekly  Symptoms  Autism: difficulty perceiving and responding to social cues, engaging in reciprocal conversation (Status: maintained). Depression: sleep irregularity, depressed mood, tearfulness, poor self confidence, feelings of loneliness (Status: maintained).  Problems Addressed  New Description  Goals 1. Felicia [name] experiences symptoms of depression related to difficulty connecting socially with others Objective Felicia [name] will increase her sense of well-being and connectedness and decrease symptoms of  depression Target Date: 2023-05-04 Frequency: Biweekly  Progress: 0 Modality: individual  Related Interventions Therapist will provide referrals for additional resources as appropriate Therapist will work with Lakresha to develop her social skills incorporating role-play exercises and video modeling, such as through the [employer] role play videos Therapist will engage Countess in behavioral activation, including the incorporation of structure, pleasant events, and mastery activities into her routine Therapist will provide Kamile with strategies to regulate her emotions, including self-care, mindfulness, meditation, and relaxation exercises Therapist will help Myda to identify and disengage from maladaptive thought patterns using CBT-based strategies Therapist will provide Shamona with opportunities to process her experiences in session Diagnosis Axis none 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

## 2023-04-08 ENCOUNTER — Ambulatory Visit (INDEPENDENT_AMBULATORY_CARE_PROVIDER_SITE_OTHER): Payer: Medicare Other | Admitting: Clinical

## 2023-04-08 DIAGNOSIS — F840 Autistic disorder: Secondary | ICD-10-CM

## 2023-04-08 DIAGNOSIS — F331 Major depressive disorder, recurrent, moderate: Secondary | ICD-10-CM | POA: Diagnosis not present
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
## 2023-04-22 ENCOUNTER — Ambulatory Visit: Payer: Medicare Other | Admitting: Clinical

## 2023-04-22 DIAGNOSIS — F331 Major depressive disorder, recurrent, moderate: Secondary | ICD-10-CM

## 2023-04-22 DIAGNOSIS — F840 Autistic disorder: Secondary | ICD-10-CM

## 2023-04-22 NOTE — Progress Notes (Signed)
Diagnosis: F84.0, F33.1 Time: 11:01 am-11:58 am CPT: 90837P  Jackqulyn was seen in person for individual therapy. Session focused on processing the results of the election and associated emotions. Therapist encouraged her to identify comforting activities and what she is able to control. She is scheduled to be seen again in two weeks.  Objectives Related Problem: Breiana experiences symptoms of depression related to difficulty connecting socially with others Description: Averly will increase her sense of well-being and connectedness and decrease symptoms of depression Target Date: 2023-05-04 Frequency: Biweekly Modality: individual Progress: 0% Planned Intervention: Therapist will provide referrals for additional resources as appropriate  Planned Intervention: Therapist will engage Tashira in behavioral activation, including the incorporation of structure, pleasant events, and mastery activities into her routine  Planned Intervention: Therapist will help Nikitia to identify and disengage from maladaptive thought and behavior patterns using CBT-based strategies  Planned Intervention: Therapist will provide Alicianna with opportunities to process her experiences in session  Planned Intervention: Therapist will provide Trixie with strategies to regulate her emotions, including self-care, mindfulness, meditation, and relaxation exercises  Treatment Plan Client Abilities/Strengths  Chaley presents as motivated to improve her situation.  Client Treatment Preferences  None noted.  Client Statement of Needs  Karalina is seeking CBT to manage symptoms of depression related to difficulty in social interactions.  Treatment Level  Biweekly  Symptoms  Autism: difficulty perceiving and responding to social cues, engaging in reciprocal conversation (Status: maintained). Depression: sleep irregularity, depressed mood, tearfulness, poor self confidence, feelings of loneliness (Status: maintained).  Problems Addressed  New  Description  Goals 1. Tylah experiences symptoms of depression related to difficulty connecting socially with others Objective Joseline will increase her sense of well-being and connectedness and decrease symptoms of depression Target Date: 2023-05-04 Frequency: Biweekly  Progress: 0 Modality: individual  Related Interventions Therapist will provide referrals for additional resources as appropriate Therapist will work with Ameena to develop her social skills incorporating role-play exercises and video modeling, such as through the [employer] role play videos Therapist will engage Hasna in behavioral activation, including the incorporation of structure, pleasant events, and mastery activities into her routine Therapist will provide Kaylena with strategies to regulate her emotions, including self-care, mindfulness, meditation, and relaxation exercises Therapist will help Neidra to identify and disengage from maladaptive thought patterns using CBT-based strategies Therapist will provide Norena with opportunities to process her experiences in session Diagnosis Axis none 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 She was in her home in [place] and the therapist was in her office at the time of the appointment. Ashanna had recorded an example of becoming frustrated with her friend, and was able to think of an example of a time when she felt appreciated. Therapist encouraged her to continue noticing when she feels appreciated by  others. Session focused on processing events in her last relationship. She is scheduled to be seen again in two weeks.  Objectives Related Problem: Venesa  experiences symptoms of depression related to difficulty connecting socially with others Description: Brittainy will increase her sense of well-being and connectedness and decrease symptoms of depression Target Date: 2023-05-04 Frequency: Biweekly Modality: individual Progress: 0% Planned Intervention: Therapist will provide referrals for additional resources as appropriate  Planned Intervention: Therapist will engage Signe in behavioral activation, including the incorporation of structure, pleasant events, and mastery activities into her routine  Planned Intervention: Therapist will help Ednamae to identify and disengage from maladaptive thought and behavior patterns using CBT-based strategies  Planned Intervention: Therapist will provide Avin with opportunities to process her experiences in session  Planned Intervention: Therapist will provide Tarisha with strategies to regulate her emotions, including self-care, mindfulness, meditation, and relaxation exercises  Treatment Plan Client Abilities/Strengths  Ellisa presents as motivated to improve her situation.  Client Treatment Preferences  None noted.  Client Statement of Needs  Sanoe is seeking CBT to manage symptoms of depression related to difficulty in social interactions.  Treatment Level  Biweekly  Symptoms  Autism: difficulty perceiving and responding to social cues, engaging in reciprocal conversation (Status: maintained). Depression: sleep irregularity, depressed mood, tearfulness, poor self confidence, feelings of loneliness (Status: maintained).  Problems Addressed  New Description  Goals 1. Salisha experiences symptoms of depression related to difficulty connecting socially with others Objective Shauntee will increase her sense of well-being and connectedness and decrease symptoms of  depression Target Date: 2023-05-04 Frequency: Biweekly  Progress: 0 Modality: individual  Related Interventions Therapist will provide referrals for additional resources as appropriate Therapist will work with Valicia to develop her social skills incorporating role-play exercises and video modeling, such as through the [employer] role play videos Therapist will engage Mylin in behavioral activation, including the incorporation of structure, pleasant events, and mastery activities into her routine Therapist will provide Nancey with strategies to regulate her emotions, including self-care, mindfulness, meditation, and relaxation exercises Therapist will help Graceyn to identify and disengage from maladaptive thought patterns using CBT-based strategies Therapist will provide Jacob with opportunities to process her experiences in session Diagnosis Axis none 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

## 2023-04-30 DIAGNOSIS — F900 Attention-deficit hyperactivity disorder, predominantly inattentive type: Secondary | ICD-10-CM | POA: Diagnosis not present

## 2023-04-30 DIAGNOSIS — F419 Anxiety disorder, unspecified: Secondary | ICD-10-CM | POA: Diagnosis not present

## 2023-04-30 DIAGNOSIS — F3489 Other specified persistent mood disorders: Secondary | ICD-10-CM | POA: Diagnosis not present

## 2023-04-30 NOTE — Progress Notes (Addendum)
  Cardiology Office Note:  .   Date:  05/01/2023  ID:  Felicia [name], DOB [birth year], MRN [id number] PCP: Felicia Corning, MD  [facility] Providers Cardiologist:  None    History of Present Illness: .   Felicia [name] is a 27 y.o. female  with hx of autism spectrum disorder  ADD   We are asked to see her for tachycardia  Seen with mother , Felicia [name]   Has had some chest tightness, dyspnea  Occasional chest pain    Very little exercise  Has some DOE climbing stairs    Ran track in high school  .  Did have some episodes of asthma like dyspnea   But has not exercised since that time   Fam hx  She is adopted.    + breast cancer , and DM      ROS:   Studies Reviewed: Marland Kitchen        EKG Interpretation Date/Time:  Friday May 01 2023 09:39:50 EST Ventricular Rate:  76 PR Interval:  160 QRS Duration:  78 QT Interval:  378 QTC Calculation: 425 R Axis:   -39  Text Interpretation: Normal sinus rhythm Left axis deviation Possible Inferior infarct , age undetermined Poor R wave progression,  cannot rule out Anterior infarct , age undetermined When compared with ECG of 14-Mar-2012 19:56, no significant changes. Confirmed by Kristeen Miss [phone]) on 05/01/2023 9:44:38 AM    Risk Assessment/Calculations:            Physical Exam:   VS:  BP 116/82   Pulse 83   Ht 5\' 3"  (1.6 m)   Wt 170 lb 6.4 oz (77.3 kg)   SpO2 96%   BMI 30.19 kg/m    Wt Readings from Last 3 Encounters:  05/01/23 170 lb 6.4 oz (77.3 kg)    GEN: Well nourished, well developed in no acute distress NECK: No JVD; No carotid bruits CARDIAC: RRR, no murmurs, rubs, gallops RESPIRATORY:  Clear to auscultation without rales, wheezing or rhonchi  ABDOMEN: Soft, non-tender, non-distended EXTREMITIES:  No edema; No deformity   ASSESSMENT AND PLAN: .   Abnormal EKG: Felicia [name] has poor R wave progression on her EKG.  She does not get any regular exercise.  She is on the autism spectrum.  I have a very  low suspicion that she has had any real cardiac issues.  I suspect that the EKG is due to lead placement or perhaps because the leads cannot be placed as accurately as they would be needed due to her breasts.   Will get an echo for further evaluation  I have encouraged her to ambulate regularly ,   work on improved diet, exercise   I will see her in 3-4 months for follow up .         Dispo: 3-4 months    Signed, Kristeen Miss, MD

## 2023-05-01 ENCOUNTER — Encounter: Payer: Self-pay | Admitting: Cardiovascular Disease

## 2023-05-01 ENCOUNTER — Ambulatory Visit: Payer: Medicare Other | Attending: Cardiovascular Disease | Admitting: Cardiovascular Disease

## 2023-05-01 VITALS — BP 116/82 | HR 83 | Ht 63.0 in | Wt 170.4 lb

## 2023-05-01 DIAGNOSIS — R000 Tachycardia, unspecified: Secondary | ICD-10-CM | POA: Diagnosis not present

## 2023-05-01 DIAGNOSIS — R9431 Abnormal electrocardiogram [ECG] [EKG]: Secondary | ICD-10-CM

## 2023-05-01 NOTE — Patient Instructions (Addendum)
Medication Instructions:   Your physician recommends that you continue on your current medications as directed. Please refer to the Current Medication list given to you today.  *If you need a refill on your cardiac medications before your next appointment, please call your pharmacy*   Testing/Procedures:  Your physician has requested that you have an echocardiogram. Echocardiography is a painless test that uses sound waves to create images of your heart. It provides your doctor with information about the size and shape of your heart and how well your heart's chambers and valves are working. This procedure takes approximately one hour. There are no restrictions for this procedure. Please do NOT wear cologne, perfume, aftershave, or lotions (deodorant is allowed). Please arrive 15 minutes prior to your appointment time.  Please note: We ask at that you not bring children with you during ultrasound (echo/ vascular) testing. Due to room size and safety concerns, children are not allowed in the ultrasound rooms during exams. Our front office staff cannot provide observation of children in our lobby area while testing is being conducted. An adult accompanying a patient to their appointment will only be allowed in the ultrasound room at the discretion of the ultrasound technician under special circumstances. We apologize for any inconvenience.    Follow-Up:  3-4 MONTHS WITH DR. Elease Hashimoto   Diet & Lifestyle recommendations:  Physical activity recommendation (The Physical Activity Guidelines for Americans. JAMA 2018;Nov 12) At least 150-300 minutes a week of moderate-intensity, or 75-150 minutes a week of vigorous-intensity aerobic physical activity, or an equivalent combination of moderate- and vigorous-intensity aerobic activity. Adults should perform muscle-strengthening activities on 2 or more days a week. Older adults should do multicomponent physical activity that includes balance training as well  as aerobic and muscle-strengthening activities. Benefits of increased physical activity include lower risk of mortality including cardiovascular mortality, lower risk of cardiovascular events and associated risk factors (hypertension and diabetes), and lower risk of many cancers (including bladder, breast, colon, endometrium, esophagus, kidney, lung, and stomach). Additional improvments have been seen in cognition, risk of dementia, anxiety and depression, improved bone health, lower risk of falls, and associated injuries.  Dietary recommendation The 2019 ACC/AHA guidelines promote nutrition as a main fixture of cardiovascular wellness, with a recommendation for a varied diet of fruit, vegetables, fish, legumes, and whole grains (Class I), as well as recommendations to reduce sodium, cholesterol, processed meats, and refined sugars (Class IIa recommendation).10 Sodium intake, a topic of some controversy as of late, is recommended to be kept at 1,500 mg/day or less, far below the average daily intake in the [place] of 3,409 mg/day, and notably below that of previous [place] recommendations for 300mg /day.10,11 For those unable to reach 1,500 mg/day, they recommend at least a reduction of 1000 mg/day.  A Pesco-Mediterranean Diet With Intermittent Fasting: JACC Review Topic of the Week. J Am Coll Cardiol 2020;76:1484-1493 Pesco-Mediterranean diet, it is supplemented with extra-virgin olive oil (EVOO), which is the principle fat source, along with moderate amounts of dairy (particularly yogurt and cheese) and eggs, as well as modest amounts of alcohol consumption (ideally red wine with the evening meal), but few red and processed meats.

## 2023-05-06 ENCOUNTER — Ambulatory Visit: Payer: Medicare Other | Admitting: Clinical

## 2023-05-06 DIAGNOSIS — F840 Autistic disorder: Secondary | ICD-10-CM

## 2023-05-06 DIAGNOSIS — F331 Major depressive disorder, recurrent, moderate: Secondary | ICD-10-CM

## 2023-05-06 NOTE — Progress Notes (Addendum)
Diagnosis: F84.0, F33.1 Time: 11:01 am-11:58 am CPT: 90837P  Matison was seen in person for individual therapy. She reflected upon her tendency to take things personally and get angry quickly. Therapist encouraged her to pause before responding when she notices she is angry, and to challenge herself to consider at least two alternate explanations of others' behavior. She also expressed a desire to be more social but also experiences social anxiety. Therapist encouraged her to push herself to get out more, and suggested the [facility] program. She is scheduled to be seen again in two weeks.  Objectives Related Problem: Markan experiences symptoms of depression related to difficulty connecting socially with others Description: Novelle will increase her sense of well-being and connectedness and decrease symptoms of depression Target Date: 2024-05-03 Frequency: Biweekly Modality: individual Progress: 0% Planned Intervention: Therapist will provide referrals for additional resources as appropriate  Planned Intervention: Therapist will engage Idaly in behavioral activation, including the incorporation of structure, pleasant events, and mastery activities into her routine  Planned Intervention: Therapist will help Demika to identify and disengage from maladaptive thought and behavior patterns using CBT-based strategies  Planned Intervention: Therapist will provide Kabrea with opportunities to process her experiences in session  Planned Intervention: Therapist will provide Saryna with strategies to regulate her emotions, including self-care, mindfulness, meditation, and relaxation exercises  Treatment Plan Client Abilities/Strengths  Niara presents as motivated to improve her situation.  Client Treatment Preferences  None noted.  Client Statement of Needs  Arianah is seeking CBT to manage symptoms of depression related to difficulty in social interactions.  Treatment Level  Biweekly  Symptoms  Autism:  difficulty perceiving and responding to social cues, engaging in reciprocal conversation (Status: maintained). Depression: sleep irregularity, depressed mood, tearfulness, poor self confidence, feelings of loneliness (Status: maintained).  Problems Addressed  New Description  Goals 1. Charlene experiences symptoms of depression related to difficulty connecting socially with others Objective Dania will increase her sense of well-being and connectedness and decrease symptoms of depression Target Date: 2024-05-03 Frequency: Biweekly  Progress: 0 Modality: individual  Related Interventions Therapist will provide referrals for additional resources as appropriate Therapist will work with Leniya to develop her social skills incorporating role-play exercises and video modeling, such as through the [employer] role play videos Therapist will engage Kaelene in behavioral activation, including the incorporation of structure, pleasant events, and mastery activities into her routine Therapist will provide Xee with strategies to regulate her emotions, including self-care, mindfulness, meditation, and relaxation exercises Therapist will help Dakotah to identify and disengage from maladaptive thought patterns using CBT-based strategies Therapist will provide Beckey with opportunities to process her experiences in session Diagnosis Axis none 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 She was in her home in [place] and the therapist was in  her office at the time of the appointment. Sydne had recorded an example of becoming frustrated with her friend, and was able to think of an example  of a time when she felt appreciated. Therapist encouraged her to continue noticing when she feels appreciated by others. Session focused on processing events in her last relationship. She is scheduled to be seen again in two weeks.  Objectives Related Problem: Samanda experiences symptoms of depression related to difficulty connecting socially with others Description: Tametha will increase her sense of well-being and connectedness and decrease symptoms of depression Target Date: 2023-05-04 Frequency: Biweekly Modality: individual Progress: 0% Planned Intervention: Therapist will provide referrals for additional resources as appropriate  Planned Intervention: Therapist will engage Damian in behavioral activation, including the incorporation of structure, pleasant events, and mastery activities into her routine  Planned Intervention: Therapist will help Haleema to identify and disengage from maladaptive thought and behavior patterns using CBT-based strategies  Planned Intervention: Therapist will provide Franchelle with opportunities to process her experiences in session  Planned Intervention: Therapist will provide Luvinia with strategies to regulate her emotions, including self-care, mindfulness, meditation, and relaxation exercises  Treatment Plan Client Abilities/Strengths  Jammie presents as motivated to improve her situation.  Client Treatment Preferences  None noted.  Client Statement of Needs  Ryleeann is seeking CBT to manage symptoms of depression related to difficulty in social interactions.  Treatment Level  Biweekly  Symptoms  Autism: difficulty perceiving and responding to social cues, engaging in reciprocal conversation (Status: maintained). Depression: sleep irregularity, depressed mood, tearfulness, poor self confidence, feelings of loneliness  (Status: maintained).  Problems Addressed  New Description  Goals 1. Nemiah experiences symptoms of depression related to difficulty connecting socially with others Objective Beretta will increase her sense of well-being and connectedness and decrease symptoms of depression Target Date: 2023-05-04 Frequency: Biweekly  Progress: 0 Modality: individual  Related Interventions Therapist will provide referrals for additional resources as appropriate Therapist will work with [name] to develop her social skills incorporating role-play exercises and video modeling, such as through the [employer] role play videos Therapist will engage Thais in behavioral activation, including the incorporation of structure, pleasant events, and mastery activities into her routine Therapist will provide Dustina with strategies to regulate her emotions, including self-care, mindfulness, meditation, and relaxation exercises Therapist will help Arielle to identify and disengage from maladaptive thought patterns using CBT-based strategies Therapist will provide Courney with opportunities to process her experiences in session Diagnosis Axis none 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

## 2023-05-11 ENCOUNTER — Ambulatory Visit (HOSPITAL_COMMUNITY)
Admission: RE | Admit: 2023-05-11 | Discharge: 2023-05-11 | Disposition: A | Discharge status: Home / Self Care | Payer: Medicare Other | Source: Ambulatory Visit | Attending: Cardiovascular Disease | Admitting: Cardiovascular Disease

## 2023-05-11 DIAGNOSIS — R9431 Abnormal electrocardiogram [ECG] [EKG]: Secondary | ICD-10-CM | POA: Diagnosis not present

## 2023-05-11 DIAGNOSIS — R000 Tachycardia, unspecified: Secondary | ICD-10-CM | POA: Diagnosis not present

## 2023-05-11 LAB — ECHOCARDIOGRAM COMPLETE
AR max vel: 2.12 cm2
AV Area VTI: 2.09 cm2
AV Area mean vel: 2.1 cm2
AV Mean grad: 3 mm[Hg]
AV Peak grad: 6 mm[Hg]
Ao pk vel: 1.22 m/s
Area-P 1/2: 5.31 cm2
S' Lateral: 3.46 cm
Single Plane A4C EF: 63.9 %

## 2023-05-20 ENCOUNTER — Ambulatory Visit (INDEPENDENT_AMBULATORY_CARE_PROVIDER_SITE_OTHER): Payer: Medicare Other | Admitting: Clinical

## 2023-05-20 DIAGNOSIS — F331 Major depressive disorder, recurrent, moderate: Secondary | ICD-10-CM

## 2023-05-20 DIAGNOSIS — F840 Autistic disorder: Secondary | ICD-10-CM | POA: Diagnosis not present

## 2023-05-20 NOTE — Progress Notes (Signed)
Diagnosis: F84.0, F33.1 Time: 11:01 am-11:58 am CPT: 90837P  Felicia [name] was seen in person for individual therapy. Her mother joined the session at [facility] request, and shared that she has noted several positive improvements in Felicia [name]'s behavior and presentation since she has started waking at 8 to take her medicines, which she has been organizing in a pill box. Felicia [name]'s Venlafaxine dosage was recently increased, and Felicia [name] and her mother noted reported positive changes. Felicia [name]'s reflected upon a good Thanksgiving, and shared about her cooking ventures over the past few weeks. She also reported that she has been talking to a potential romantic interest from middle school in recent months, but has been concerned that he has not yet wanted to meet in person. Therapist encouraged her to pay attention to how she feels and if she notices that the relationship is making her feel bad, consider making changes. She is scheduled to be seen again in two weeks.   Objectives Related Problem: Felicia [name] experiences symptoms of depression related to difficulty connecting socially with others Description: Felicia [name] will increase her sense of well-being and connectedness and decrease symptoms of depression Target Date: 2024-05-03 Frequency: Biweekly Modality: individual Progress: 0% Planned Intervention: Therapist will provide referrals for additional resources as appropriate  Planned Intervention: Therapist will engage Felicia [name] in behavioral activation, including the incorporation of structure, pleasant events, and mastery activities into her routine  Planned Intervention: Therapist will help Felicia [name] to identify and disengage from maladaptive thought and behavior patterns using CBT-based strategies  Planned Intervention: Therapist will provide Felicia [name] with opportunities to process her experiences in session  Planned Intervention: Therapist will provide Felicia [name] with strategies to regulate her emotions, including self-care, mindfulness,  meditation, and relaxation exercises  Treatment Plan Client Abilities/Strengths  Felicia [name] presents as motivated to improve her situation.  Client Treatment Preferences  None noted.  Client Statement of Needs  Felicia [name] is seeking CBT to manage symptoms of depression related to difficulty in social interactions.  Treatment Level  Biweekly  Symptoms  Autism: difficulty perceiving and responding to social cues, engaging in reciprocal conversation (Status: maintained). Depression: sleep irregularity, depressed mood, tearfulness, poor self confidence, feelings of loneliness (Status: maintained).  Problems Addressed  New Description  Goals 1. Felicia [name] experiences symptoms of depression related to difficulty connecting socially with others Objective Felicia [name] will increase her sense of well-being and connectedness and decrease symptoms of depression Target Date: 2024-05-03 Frequency: Biweekly  Progress: 0 Modality: individual  Related Interventions Therapist will provide referrals for additional resources as appropriate Therapist will work with Felicia [name] to develop her social skills incorporating role-play exercises and video modeling, such as through the [employer] role play videos Therapist will engage Felicia [name] in behavioral activation, including the incorporation of structure, pleasant events, and mastery activities into her routine Therapist will provide Felicia [name] with strategies to regulate her emotions, including self-care, mindfulness, meditation, and relaxation exercises Therapist will help Felicia [name] to identify and disengage from maladaptive thought patterns using CBT-based strategies Therapist will provide Felicia [name] with opportunities to process her experiences in session Diagnosis Axis none 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 She was in her home in [place] and the therapist was in her office at the time of the appointment. Felicia [name] had recorded an example of becoming frustrated with her friend, and was able to think of an example of a time when she felt appreciated. Therapist encouraged her to continue noticing when she feels appreciated by others. Session focused on processing events in her last relationship. She is scheduled to be seen again in two weeks.  Objectives Related Problem: Felicia [name] experiences symptoms of depression related to difficulty connecting socially with others Description: Felicia [name] will increase her sense of well-being and connectedness and decrease symptoms of depression Target Date: 2023-05-04 Frequency: Biweekly Modality: individual Progress: 0% Planned Intervention: Therapist will provide referrals for additional resources as appropriate  Planned Intervention: Therapist will engage Felicia [name] in behavioral activation, including the incorporation of structure, pleasant events, and mastery activities into her routine  Planned Intervention: Therapist will help Felicia [name] to identify and disengage from maladaptive thought and behavior patterns using CBT-based strategies  Planned Intervention: Therapist will provide Felicia [name] with opportunities to process her experiences in session  Planned Intervention: Therapist will provide Felicia [name] with strategies to regulate her emotions, including self-care, mindfulness, meditation, and relaxation exercises  Treatment Plan Client Abilities/Strengths  Felicia [name] presents as motivated to improve her situation.  Client Treatment Preferences  None noted.  Client Statement of Needs  Felicia [name] is  seeking CBT to manage symptoms of depression related to difficulty in social interactions.  Treatment Level  Biweekly  Symptoms  Autism: difficulty perceiving and responding to social cues, engaging in reciprocal conversation (Status: maintained). Depression: sleep irregularity, depressed mood, tearfulness, poor self confidence, feelings of loneliness (Status: maintained).  Problems Addressed  New Description  Goals 1. Layaan experiences symptoms of depression related to difficulty connecting socially with others Objective Aubrey will increase her sense of well-being and connectedness and decrease symptoms of depression Target Date: 2023-05-04 Frequency: Biweekly  Progress: 0 Modality: individual  Related Interventions Therapist will provide referrals for additional resources as appropriate Therapist will work with Tanaja to develop her social skills incorporating role-play exercises and video modeling, such as through the [employer] role play videos Therapist will engage Latayvia in behavioral activation, including the incorporation of structure, pleasant events, and mastery activities into her routine Therapist will provide Khrystina with strategies to regulate her emotions, including self-care, mindfulness, meditation, and relaxation exercises Therapist will help Adalaya to identify and disengage from maladaptive thought patterns using CBT-based strategies Therapist will provide Alexiah with opportunities to process her experiences in session Diagnosis Axis none 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

## 2023-06-03 ENCOUNTER — Ambulatory Visit: Payer: Medicare Other | Admitting: Clinical

## 2023-06-03 DIAGNOSIS — F840 Autistic disorder: Secondary | ICD-10-CM | POA: Diagnosis not present

## 2023-06-03 DIAGNOSIS — F331 Major depressive disorder, recurrent, moderate: Secondary | ICD-10-CM

## 2023-06-03 NOTE — Progress Notes (Signed)
Diagnosis: F84.0, F33.1 Time: 11:01 am-11:58 am CPT: 90837P  Felicia [name] was seen in person for individual therapy. She reported that she had interviewed for the job at [employer] and it had gone [name], but she is feeling very stressed due a sense of being pressured into it. Therapist engaged her in discussion of her own goals and consideration of whether and how the job might fit with her goals or not. She also reflected upon her relationships, and therapist engaged her in discussion of how she might factor in and learn from past experiences while also acknowledging the present facts of a relationship. She is scheduled to be seen again in one month.   Objectives Related Problem: Felicia [name] experiences symptoms of depression related to difficulty connecting socially with others Description: Felicia [name] will increase her sense of [name]-being and connectedness and decrease symptoms of depression Target Date: 2024-05-03 Frequency: Biweekly Modality: individual Progress: 0% Planned Intervention: Therapist will provide referrals for additional resources as appropriate  Planned Intervention: Therapist will engage Felicia [name] in behavioral activation, including the incorporation of structure, pleasant events, and mastery activities into her routine  Planned Intervention: Therapist will help Felicia [name] to identify and disengage from maladaptive thought and behavior patterns using CBT-based strategies  Planned Intervention: Therapist will provide Felicia [name] with opportunities to process her experiences in session  Planned Intervention: Therapist will provide Felicia [name] with strategies to regulate her emotions, including self-care, mindfulness, meditation, and relaxation exercises  Treatment Plan Client Abilities/Strengths  Felicia [name] presents as motivated to improve her situation.  Client Treatment Preferences  None noted.  Client Statement of Needs  Felicia [name] is seeking CBT to manage symptoms of depression related to difficulty in social  interactions.  Treatment Level  Biweekly  Symptoms  Autism: difficulty perceiving and responding to social cues, engaging in reciprocal conversation (Status: maintained). Depression: sleep irregularity, depressed mood, tearfulness, poor self confidence, feelings of loneliness (Status: maintained).  Problems Addressed  New Description  Goals 1. Tanaja experiences symptoms of depression related to difficulty connecting socially with others Objective Felicia [name] will increase her sense of [name]-being and connectedness and decrease symptoms of depression Target Date: 2024-05-03 Frequency: Biweekly  Progress: 0 Modality: individual  Related Interventions Therapist will provide referrals for additional resources as appropriate Therapist will work with Felicia [name] to develop her social skills incorporating role-play exercises and video modeling, such as through the [employer] role play videos Therapist will engage Felicia [name] in behavioral activation, including the incorporation of structure, pleasant events, and mastery activities into her routine Therapist will provide Felicia [name] with strategies to regulate her emotions, including self-care, mindfulness, meditation, and relaxation exercises Therapist will help Felicia [name] to identify and disengage from maladaptive thought patterns using CBT-based strategies Therapist will provide Felicia [name] with opportunities to process her experiences in session Diagnosis Axis none 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  She was in her home in [place] and the therapist was in her office at the time of the appointment. Felicia [name]  had recorded an example of becoming frustrated with her friend, and was able to think of an example of a time when she felt appreciated. Therapist encouraged her to continue noticing when she feels appreciated by others. Session focused on processing events in her last relationship. She is scheduled to be seen again in two weeks.  Objectives Related Problem: Felicia [name] experiences symptoms of depression related to difficulty connecting socially with others Description: Felicia [name]-being and connectedness and decrease symptoms of depression Target Date: 2023-05-04 Frequency: Biweekly Modality: individual Progress: 0% Planned Intervention: Therapist will provide referrals for additional resources as appropriate  Planned Intervention: Therapist will engage Felicia [name] in behavioral activation, including the incorporation of structure, pleasant events, and mastery activities into her routine  Planned Intervention: Therapist will help Felicia [name] to identify and disengage from maladaptive thought and behavior patterns using CBT-based strategies  Planned Intervention: Therapist will provide Felicia [name] with opportunities to process her experiences in session  Planned Intervention: Therapist will provide Felicia [name] with strategies to regulate her emotions, including self-care, mindfulness, meditation, and relaxation exercises  Treatment Plan Client Abilities/Strengths  Felicia [name] presents as motivated to improve her situation.  Client Treatment Preferences  None noted.  Client Statement of Needs  Felicia [name] is seeking CBT to manage symptoms of depression related to difficulty in social interactions.  Treatment Level  Biweekly  Symptoms  Autism: difficulty perceiving and responding to social cues, engaging in reciprocal conversation (Status: maintained). Depression: sleep irregularity, depressed mood,  tearfulness, poor self confidence, feelings of loneliness (Status: maintained).  Problems Addressed  New Description  Goals 1. Doreather experiences symptoms of depression related to difficulty connecting socially with others Objective Timya will increase her sense of [name]-being and connectedness and decrease symptoms of depression Target Date: 2023-05-04 Frequency: Biweekly  Progress: 0 Modality: individual  Related Interventions Therapist will provide referrals for additional resources as appropriate Therapist will work with Waylon to develop her social skills incorporating role-play exercises and video modeling, such as through the [employer] role play videos Therapist will engage Skya in behavioral activation, including the incorporation of structure, pleasant events, and mastery activities into her routine Therapist will provide Airabella with strategies to regulate her emotions, including self-care, mindfulness, meditation, and relaxation exercises Therapist will help Jazalynn to identify and disengage from maladaptive thought patterns using CBT-based strategies Therapist will provide Zora with opportunities to process her experiences in session Diagnosis Axis none 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

## 2023-06-04 DIAGNOSIS — K080 Exfoliation of teeth due to systemic causes: Secondary | ICD-10-CM | POA: Diagnosis not present

## 2023-06-24 DIAGNOSIS — E785 Hyperlipidemia, unspecified: Secondary | ICD-10-CM | POA: Diagnosis not present

## 2023-06-24 DIAGNOSIS — F331 Major depressive disorder, recurrent, moderate: Secondary | ICD-10-CM | POA: Diagnosis not present

## 2023-06-24 DIAGNOSIS — Z124 Encounter for screening for malignant neoplasm of cervix: Secondary | ICD-10-CM | POA: Diagnosis not present

## 2023-06-30 DIAGNOSIS — F419 Anxiety disorder, unspecified: Secondary | ICD-10-CM | POA: Diagnosis not present

## 2023-06-30 DIAGNOSIS — F3489 Other specified persistent mood disorders: Secondary | ICD-10-CM | POA: Diagnosis not present

## 2023-06-30 DIAGNOSIS — F900 Attention-deficit hyperactivity disorder, predominantly inattentive type: Secondary | ICD-10-CM | POA: Diagnosis not present

## 2023-07-01 ENCOUNTER — Ambulatory Visit: Payer: Medicare Other | Admitting: Clinical

## 2023-07-01 DIAGNOSIS — F331 Major depressive disorder, recurrent, moderate: Secondary | ICD-10-CM

## 2023-07-01 DIAGNOSIS — F840 Autistic disorder: Secondary | ICD-10-CM

## 2023-07-01 NOTE — Progress Notes (Signed)
 Diagnosis: F84.0, F33.1 Time: 11:01 am-11:58 am CPT: 90837P  Janet was seen in person for individual therapy, accompanied by her mother at her request. Session focused on continuing to unpack her feelings about the possibility of working at [employer], where she had been offered a job. She created a plan to work there for one week, and to request a schedule of once daily. Therapist also encouraged her to give two weeks notice if she decides to quit. Therapist recommended reaching out to [facility] to access job coaching services if Aiman opts not to stay at [employer]. She is scheduled to be seen again in two weeks.  Objectives Related Problem: Johnnae experiences symptoms of depression related to difficulty connecting socially with others Description: Naylah will increase her sense of well-being and connectedness and decrease symptoms of depression Target Date: 2024-05-03 Frequency: Biweekly Modality: individual Progress: 0% Planned Intervention: Therapist will provide referrals for additional resources as appropriate  Planned Intervention: Therapist will engage Merilynn in behavioral activation, including the incorporation of structure, pleasant events, and mastery activities into her routine  Planned Intervention: Therapist will help Envi to identify and disengage from maladaptive thought and behavior patterns using CBT-based strategies  Planned Intervention: Therapist will provide Rainy with opportunities to process her experiences in session  Planned Intervention: Therapist will provide Librada with strategies to regulate her emotions, including self-care, mindfulness, meditation, and relaxation exercises  Treatment Plan Client Abilities/Strengths  Vonda presents as motivated to improve her situation.  Client Treatment Preferences  None noted.  Client Statement of Needs  Lam is seeking CBT to manage symptoms of depression related to difficulty in social interactions.  Treatment  Level  Biweekly  Symptoms  Autism: difficulty perceiving and responding to social cues, engaging in reciprocal conversation (Status: maintained). Depression: sleep irregularity, depressed mood, tearfulness, poor self confidence, feelings of loneliness (Status: maintained).  Problems Addressed  New Description  Goals 1. Krystyne experiences symptoms of depression related to difficulty connecting socially with others Objective Jerusha will increase her sense of well-being and connectedness and decrease symptoms of depression Target Date: 2024-05-03 Frequency: Biweekly  Progress: 0 Modality: individual  Related Interventions Therapist will provide referrals for additional resources as appropriate Therapist will work with Lee-Anne to develop her social skills incorporating role-play exercises and video modeling, such as through the [employer] role play videos Therapist will engage Heylee in behavioral activation, including the incorporation of structure, pleasant events, and mastery activities into her routine Therapist will provide Esthefany with strategies to regulate her emotions, including self-care, mindfulness, meditation, and relaxation exercises Therapist will help Talisa to identify and disengage from maladaptive thought patterns using CBT-based strategies Therapist will provide Suly with opportunities to process her experiences in session Diagnosis Axis none 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 She was in her home in    and the therapist was in her office at the time of the appointment. Kavya had recorded an example of becoming  frustrated with her friend, and was able to think of an example of a time when she felt appreciated. Therapist encouraged her to continue noticing when she feels appreciated by others. Session focused on processing events in her last relationship. She is scheduled to be seen again in two weeks.  Objectives Related Problem: Hanne experiences symptoms of depression related to difficulty connecting socially with others Description: Destynee will increase her sense of well-being and connectedness and decrease symptoms of depression Target Date: 2023-05-04 Frequency: Biweekly Modality: individual Progress: 0% Planned Intervention: Therapist will provide referrals for additional resources as appropriate  Planned Intervention: Therapist will engage Aidan in behavioral activation, including the incorporation of structure, pleasant events, and mastery activities into her routine  Planned Intervention: Therapist will help Luberta to identify and disengage from maladaptive thought and behavior patterns using CBT-based strategies  Planned Intervention: Therapist will provide Mayia with opportunities to process her experiences in session  Planned Intervention: Therapist will provide Shamiah with strategies to regulate her emotions, including self-care, mindfulness, meditation, and relaxation exercises  Treatment Plan Client Abilities/Strengths  Terrace presents as motivated to improve her situation.  Client Treatment Preferences  None noted.  Client Statement of Needs  Harli is seeking CBT to manage symptoms of depression related to difficulty in social interactions.  Treatment Level  Biweekly  Symptoms  Autism: difficulty perceiving and responding to social cues, engaging in reciprocal conversation (Status: maintained). Depression: sleep irregularity, depressed mood, tearfulness, poor self  confidence, feelings of loneliness (Status: maintained).  Problems Addressed  New Description  Goals 1. Kiandria experiences symptoms of depression related to difficulty connecting socially with others Objective Chyrl will increase her sense of well-being and connectedness and decrease symptoms of depression Target Date: 2023-05-04 Frequency: Biweekly  Progress: 0 Modality: individual  Related Interventions Therapist will provide referrals for additional resources as appropriate Therapist will work with Chandrea to develop her social skills incorporating role-play exercises and video modeling, such as through the [employer] role play videos Therapist will engage Isebella in behavioral activation, including the incorporation of structure, pleasant events, and mastery activities into her routine Therapist will provide Margalit with strategies to regulate her emotions, including self-care, mindfulness, meditation, and relaxation exercises Therapist will help Gema to identify and disengage from maladaptive thought patterns using CBT-based strategies Therapist will provide Jaemarie with opportunities to process her experiences in session Diagnosis Axis none 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

## 2023-07-15 ENCOUNTER — Ambulatory Visit: Payer: Medicare Other | Admitting: Clinical

## 2023-07-15 DIAGNOSIS — F840 Autistic disorder: Secondary | ICD-10-CM | POA: Diagnosis not present

## 2023-07-15 DIAGNOSIS — F331 Major depressive disorder, recurrent, moderate: Secondary | ICD-10-CM

## 2023-07-15 NOTE — Progress Notes (Signed)
Diagnosis: F84.0, F33.1 Time: 11:01 am-11:58 am CPT: 90837P  Merissa was seen in person for individual therapy, accompanied by her mother at her request. She had successfully started at [employer], and reported improved feelings about the job and increased openness to staying. Session focused on her frustrations with onboarding tasks. Therapist engaged her in discussion of how to ask for help before having a meltdown. She is scheduled to be seen again in two weeks.  Objectives Related Problem: Elvy experiences symptoms of depression related to difficulty connecting socially with others Description: Arpita will increase her sense of well-being and connectedness and decrease symptoms of depression Target Date: 2024-05-03 Frequency: Biweekly Modality: individual Progress: 0% Planned Intervention: Therapist will provide referrals for additional resources as appropriate  Planned Intervention: Therapist will engage Erabella in behavioral activation, including the incorporation of structure, pleasant events, and mastery activities into her routine  Planned Intervention: Therapist will help Ieesha to identify and disengage from maladaptive thought and behavior patterns using CBT-based strategies  Planned Intervention: Therapist will provide Bernell with opportunities to process her experiences in session  Planned Intervention: Therapist will provide Milania with strategies to regulate her emotions, including self-care, mindfulness, meditation, and relaxation exercises  Treatment Plan Client Abilities/Strengths  Jacki presents as motivated to improve her situation.  Client Treatment Preferences  None noted.  Client Statement of Needs  Lynia is seeking CBT to manage symptoms of depression related to difficulty in social interactions.  Treatment Level  Biweekly  Symptoms  Autism: difficulty perceiving and responding to social cues, engaging in reciprocal conversation (Status: maintained). Depression: sleep  irregularity, depressed mood, tearfulness, poor self confidence, feelings of loneliness (Status: maintained).  Problems Addressed  New Description  Goals 1. Deazia experiences symptoms of depression related to difficulty connecting socially with others Objective Milah will increase her sense of well-being and connectedness and decrease symptoms of depression Target Date: 2024-05-03 Frequency: Biweekly  Progress: 0 Modality: individual  Related Interventions Therapist will provide referrals for additional resources as appropriate Therapist will work with Kale to develop her social skills incorporating role-play exercises and video modeling, such as through the [employer] role play videos Therapist will engage Bethan in behavioral activation, including the incorporation of structure, pleasant events, and mastery activities into her routine Therapist will provide Shaylyn with strategies to regulate her emotions, including self-care, mindfulness, meditation, and relaxation exercises Therapist will help Breyon to identify and disengage from maladaptive thought patterns using CBT-based strategies Therapist will provide Cayleen with opportunities to process her experiences in session Diagnosis Axis none 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 She was in her home in [place] and the therapist was in her office at the time of the appointment. Sabeen had recorded an example of becoming frustrated with her friend, and was able to  think of an example of a time when she felt appreciated. Therapist encouraged her to continue noticing when she feels appreciated by others. Session focused  on processing events in her last relationship. She is scheduled to be seen again in two weeks.  Objectives Related Problem: Brisa experiences symptoms of depression related to difficulty connecting socially with others Description: Cyndra will increase her sense of well-being and connectedness and decrease symptoms of depression Target Date: 2023-05-04 Frequency: Biweekly Modality: individual Progress: 0% Planned Intervention: Therapist will provide referrals for additional resources as appropriate  Planned Intervention: Therapist will engage Josalyn in behavioral activation, including the incorporation of structure, pleasant events, and mastery activities into her routine  Planned Intervention: Therapist will help Kyrstal to identify and disengage from maladaptive thought and behavior patterns using CBT-based strategies  Planned Intervention: Therapist will provide Samyukta with opportunities to process her experiences in session  Planned Intervention: Therapist will provide Elana with strategies to regulate her emotions, including self-care, mindfulness, meditation, and relaxation exercises  Treatment Plan Client Abilities/Strengths  Paola presents as motivated to improve her situation.  Client Treatment Preferences  None noted.  Client Statement of Needs  Mirjana is seeking CBT to manage symptoms of depression related to difficulty in social interactions.  Treatment Level  Biweekly  Symptoms  Autism: difficulty perceiving and responding to social cues, engaging in reciprocal conversation (Status: maintained). Depression: sleep irregularity, depressed mood, tearfulness, poor self confidence, feelings of loneliness (Status: maintained).  Problems Addressed  New Description  Goals 1. Emersynn experiences symptoms of depression related to  difficulty connecting socially with others Objective Rilyn will increase her sense of well-being and connectedness and decrease symptoms of depression Target Date: 2023-05-04 Frequency: Biweekly  Progress: 0 Modality: individual  Related Interventions Therapist will provide referrals for additional resources as appropriate Therapist will work with Jameriah to develop her social skills incorporating role-play exercises and video modeling, such as through the [employer] role play videos Therapist will engage Skarlet in behavioral activation, including the incorporation of structure, pleasant events, and mastery activities into her routine Therapist will provide Lamija with strategies to regulate her emotions, including self-care, mindfulness, meditation, and relaxation exercises Therapist will help Raynette to identify and disengage from maladaptive thought patterns using CBT-based strategies Therapist will provide Roza with opportunities to process her experiences in session Diagnosis Axis none 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

## 2023-07-29 ENCOUNTER — Ambulatory Visit: Payer: Medicare Other | Admitting: Clinical

## 2023-07-29 DIAGNOSIS — F331 Major depressive disorder, recurrent, moderate: Secondary | ICD-10-CM | POA: Diagnosis not present

## 2023-07-29 DIAGNOSIS — F840 Autistic disorder: Secondary | ICD-10-CM | POA: Diagnosis not present

## 2023-07-29 NOTE — Progress Notes (Signed)
Diagnosis: F84.0, F33.1 Time: 11:01 am-11:58 am CPT: 90837P  Felicia [name] was seen in person for individual therapy. She reported that she has made strides at her new job, and increasingly enjoys it. 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 Therapist engaged her in discussion of her initial response to the idea of the job, and she agreed that much of it was due to the pressure she felt, rather than the job itself. Therapist worked with her to consider how she might be able to manage that response in the future, so as not to miss out on good opportunities. Therapist also engaged her in discussion of communication strategies she might use  during interactions with customers at work. She is scheduled to be seen again in two weeks.  Objectives Related Problem: Felicia [name] experiences symptoms of depression related to difficulty connecting socially with others Description: Felicia [name] will increase her sense of well-being and connectedness and decrease symptoms of depression Target Date: 2024-05-03 Frequency: Biweekly Modality: individual Progress: 0% Planned Intervention: Therapist will provide referrals for additional resources as appropriate  Planned Intervention: Therapist will engage Felicia [name] in behavioral activation, including the incorporation of structure, pleasant events, and mastery activities into her routine  Planned Intervention: Therapist will help Felicia [name] to identify and disengage from maladaptive thought and behavior patterns using CBT-based strategies  Planned Intervention: Therapist will provide Felicia [name] with opportunities to process her experiences in session  Planned Intervention: Therapist will provide Felicia [name] with strategies to regulate her emotions, including self-care, mindfulness, meditation, and relaxation exercises  Treatment Plan Client Abilities/Strengths  Felicia [name] presents as motivated to improve her situation.  Client Treatment Preferences  None noted.   Client Statement of Needs  Felicia [name] is seeking CBT to manage symptoms of depression related to difficulty in social interactions.  Treatment Level  Biweekly  Symptoms  Autism: difficulty perceiving and responding to social cues, engaging in reciprocal conversation (Status: maintained). Depression: sleep irregularity, depressed mood, tearfulness, poor self confidence, feelings of loneliness (Status: maintained).  Problems Addressed  New Description  Goals 1. Felicia [name] experiences symptoms of depression related to difficulty connecting socially with others Objective Felicia [name] will increase her sense of well-being and connectedness and decrease symptoms of depression Target Date: 2024-05-03 Frequency: Biweekly  Progress: 0 Modality: individual  Related Interventions Therapist will provide referrals for additional resources as appropriate Therapist will work with Felicia [name] to develop her social skills incorporating role-play exercises and video modeling, such as through the [employer] role play videos Therapist will engage Felicia [name] in behavioral activation, including the incorporation of structure, pleasant events, and mastery activities into her routine Therapist will provide Felicia [name] with strategies to regulate her emotions, including self-care, mindfulness, meditation, and relaxation exercises Therapist will help Felicia [name] to identify and disengage from maladaptive thought patterns using CBT-based strategies Therapist will provide Felicia [name] with opportunities to process her experiences in session Diagnosis Axis none 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 She was in her home in [place] and the therapist was in her office at the time of the appointment. Felicia [name] had recorded an example of becoming frustrated with her friend, and was able to think of an example of a time when she felt appreciated. Therapist encouraged her to continue noticing when she feels appreciated by others. Session focused on processing events in her last relationship. She is scheduled to be seen again in two weeks.  Objectives Related Problem: Felicia [name] experiences symptoms of depression related to difficulty connecting socially with others Description: Felicia [name] will increase her sense of well-being and connectedness and decrease symptoms of depression Target Date: 2023-05-04 Frequency: Biweekly Modality: individual Progress: 0% Planned Intervention: Therapist will provide referrals for additional resources as appropriate  Planned Intervention: Therapist will engage Felicia [name] in behavioral activation, including the incorporation of structure, pleasant events, and mastery activities into her routine  Planned Intervention: Therapist will help Felicia [name] to identify and disengage from maladaptive thought and behavior patterns using CBT-based strategies  Planned Intervention: Therapist will provide Felicia [name] with opportunities to process her experiences in session  Planned Intervention: Therapist will provide Felicia [name] with strategies to regulate her emotions, including self-care, mindfulness, meditation, and relaxation exercises  Treatment Plan Client Abilities/Strengths  Felicia [name] presents as motivated to improve her situation.  Client Treatment Preferences  None noted.  Client Statement of Needs  Felicia [name] is seeking CBT to manage symptoms of depression related to difficulty in social interactions.  Treatment Level  Biweekly  Symptoms  Autism: difficulty perceiving and responding to  social cues, engaging in reciprocal conversation (Status: maintained). Depression: sleep irregularity, depressed mood, tearfulness, poor self confidence, feelings of loneliness (Status: maintained).  Problems Addressed  New Description  Goals 1. Felicia [name] experiences symptoms of depression related to difficulty connecting socially with others Objective Felicia [name] will increase her sense of well-being and connectedness and decrease symptoms of depression Target Date: 2023-05-04 Frequency: Biweekly  Progress: 0 Modality: individual  Related Interventions Therapist will provide referrals for additional resources as appropriate Therapist will work with Felicia [name] to develop her social skills incorporating role-play exercises and video modeling, such as through the [employer] role play videos Therapist will engage Felicia [name] in behavioral activation, including the incorporation of structure, pleasant events, and mastery activities into her routine Therapist will provide Felicia [name] with strategies to regulate her emotions, including self-care, mindfulness, meditation, and relaxation exercises Therapist will help Felicia [name] to identify and disengage from maladaptive thought patterns using CBT-based strategies Therapist will provide Morrissa with opportunities to process her experiences in session Diagnosis Axis none 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

## 2023-08-12 ENCOUNTER — Ambulatory Visit: Payer: Medicare Other | Admitting: Clinical

## 2023-08-12 DIAGNOSIS — F331 Major depressive disorder, recurrent, moderate: Secondary | ICD-10-CM

## 2023-08-12 DIAGNOSIS — F840 Autistic disorder: Secondary | ICD-10-CM

## 2023-08-12 NOTE — Progress Notes (Signed)
 Diagnosis: F84.0, F33.1 Time: 11:01 am-11:58 am CPT: 90837P  Felicia [name] was seen in person for individual therapy. 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 Session focused on considering how to professionally and politely communicate this to her employer. She is scheduled to be seen again in two weeks.  Objectives Related Problem: Felicia [name] experiences symptoms of depression related to difficulty connecting socially with others Description: Felicia [name] will increase her sense of well-being and connectedness and decrease symptoms of depression Target Date: 2024-05-03 Frequency: Biweekly Modality: individual Progress: 0% Planned Intervention: Therapist will provide referrals for additional resources as appropriate  Planned Intervention: Therapist will engage Felicia [name] in behavioral activation, including the incorporation of structure, pleasant events, and mastery activities into her routine  Planned Intervention: Therapist will help Felicia [name] to identify and disengage from maladaptive thought and behavior patterns using CBT-based strategies  Planned Intervention: Therapist will provide Felicia [name] with opportunities to process her experiences in session  Planned Intervention: Therapist will provide Felicia [name] with strategies to regulate her emotions, including self-care, mindfulness, meditation, and relaxation exercises  Treatment Plan Client Abilities/Strengths  Felicia [name] presents as motivated to improve her situation.  Client Treatment Preferences  None noted.  Client Statement of Needs  Felicia [name] is seeking CBT to manage symptoms of depression related to difficulty in social interactions.  Treatment Level  Biweekly  Symptoms  Autism: difficulty perceiving and responding to social cues, engaging in reciprocal conversation (Status: maintained). Depression: sleep irregularity, depressed mood, tearfulness, poor self  confidence, feelings of loneliness (Status: maintained).  Problems Addressed  New Description  Goals 1. Felicia [name] experiences symptoms of depression related to difficulty connecting socially with others Objective Felicia [name] will increase her sense of well-being and connectedness and decrease symptoms of depression Target Date: 2024-05-03 Frequency: Biweekly  Progress: 0 Modality: individual  Related Interventions Therapist will provide referrals for additional resources as appropriate Therapist will work with Felicia [name] to develop her social skills incorporating role-play exercises and video modeling, such as through the [employer] role play videos Therapist will engage Felicia [name] in behavioral activation, including the incorporation of structure, pleasant events, and mastery activities into her routine Therapist will provide Felicia [name] with strategies to regulate her emotions, including self-care, mindfulness, meditation, and relaxation exercises Therapist will help Felicia [name] to identify and disengage from maladaptive thought patterns using CBT-based strategies Therapist will provide Felicia [name] with opportunities to process her experiences in session Diagnosis Axis none 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 She was in her home in [place] and the therapist was in her office at the time of the appointment. Felicia [name] had recorded an example of becoming frustrated with her friend, and was able to think of an example of a time when she felt  appreciated. Therapist encouraged her to continue noticing when she feels appreciated by others. Session focused on processing events in her  last relationship. She is scheduled to be seen again in two weeks.  Objectives Related Problem: Felicia [name] experiences symptoms of depression related to difficulty connecting socially with others Description: Felicia [name] will increase her sense of well-being and connectedness and decrease symptoms of depression Target Date: 2023-05-04 Frequency: Biweekly Modality: individual Progress: 0% Planned Intervention: Therapist will provide referrals for additional resources as appropriate  Planned Intervention: Therapist will engage Felicia [name] in behavioral activation, including the incorporation of structure, pleasant events, and mastery activities into her routine  Planned Intervention: Therapist will help Felicia [name] to identify and disengage from maladaptive thought and behavior patterns using CBT-based strategies  Planned Intervention: Therapist will provide Felicia [name] with opportunities to process her experiences in session  Planned Intervention: Therapist will provide Felicia [name] with strategies to regulate her emotions, including self-care, mindfulness, meditation, and relaxation exercises  Treatment Plan Client Abilities/Strengths  Felicia [name] presents as motivated to improve her situation.  Client Treatment Preferences  None noted.  Client Statement of Needs  Felicia [name] is seeking CBT to manage symptoms of depression related to difficulty in social interactions.  Treatment Level  Biweekly  Symptoms  Autism: difficulty perceiving and responding to social cues, engaging in reciprocal conversation (Status: maintained). Depression: sleep irregularity, depressed mood, tearfulness, poor self confidence, feelings of loneliness (Status: maintained).  Problems Addressed  New Description  Goals 1. Felicia [name] experiences symptoms of depression related to difficulty connecting socially with  others Objective Felicia [name] will increase her sense of well-being and connectedness and decrease symptoms of depression Target Date: 2023-05-04 Frequency: Biweekly  Progress: 0 Modality: individual  Related Interventions Therapist will provide referrals for additional resources as appropriate Therapist will work with Felicia [name] to develop her social skills incorporating role-play exercises and video modeling, such as through the [employer] role play videos Therapist will engage Felicia [name] in behavioral activation, including the incorporation of structure, pleasant events, and mastery activities into her routine Therapist will provide Felicia [name] with strategies to regulate her emotions, including self-care, mindfulness, meditation, and relaxation exercises Therapist will help Pritika to identify and disengage from maladaptive thought patterns using CBT-based strategies Therapist will provide Emileigh with opportunities to process her experiences in session Diagnosis Axis none 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

## 2023-08-13 NOTE — Progress Notes (Unsigned)
  Cardiology Office Note:  .   Date:  08/14/2023  ID:  Felicia [name], DOB [birth year], MRN [id number] PCP: Lorenda Ishihara, MD  [facility] Providers Cardiologist:  None    History of Present Illness: .   Felicia [name] is a 28 y.o. female  with hx of autism spectrum disorder  ADD   We are asked to see her for tachycardia  Seen with mother , Toniann Fail   Has had some chest tightness, dyspnea  Occasional chest pain    Very little exercise  Has some DOE climbing stairs    Ran track in high school  .  Did have some episodes of asthma like dyspnea   But has not exercised since that time   Fam hx  She is adopted.    + breast cancer , and DM     Feb. 28, 2025 Felicia [name] is seen for follow up of her tachycardia Is on the autistic spectrum  Seen with mother , Toniann Fail  Is very active at her job- works at [employer]     ROS:   Studies Reviewed: .             Risk Assessment/Calculations:     Physical Exam:     Physical Exam: Blood pressure 104/76, pulse 82, height 5\' 3"  (1.6 m), weight 176 lb (79.8 kg), SpO2 95%.      GEN:  Well nourished, well developed in no acute distress HEENT: Normal NECK: No JVD; No carotid bruits LYMPHATICS: No lymphadenopathy CARDIAC: RRR , no murmurs, rubs, gallops RESPIRATORY:  Clear to auscultation without rales, wheezing or rhonchi  ABDOMEN: Soft, non-tender, non-distended MUSCULOSKELETAL:  No edema; No deformity  SKIN: Warm and dry NEUROLOGIC:  Alert and oriented x 3   ASSESSMENT AND PLAN: .     Tachycardia :     She has episodes of fast heart rate associated with anxiety when she gets overwhelmed.  She is also on Adderall which is contributing. At this time I think that all this is fairly normal.  Have encouraged her to continue to exercise.  I encouraged her to get out and walk several times a week.  She remains very active at her job at [employer].  Will see her on an as-needed basis.       Signed, Kristeen Miss, MD

## 2023-08-14 ENCOUNTER — Ambulatory Visit: Payer: Medicare Other | Attending: Cardiovascular Disease | Admitting: Cardiovascular Disease

## 2023-08-14 ENCOUNTER — Encounter: Payer: Self-pay | Admitting: Cardiovascular Disease

## 2023-08-14 VITALS — BP 104/76 | HR 82 | Ht 63.0 in | Wt 176.0 lb

## 2023-08-14 DIAGNOSIS — R9431 Abnormal electrocardiogram [ECG] [EKG]: Secondary | ICD-10-CM | POA: Insufficient documentation

## 2023-08-14 NOTE — Patient Instructions (Signed)
 Medication Instructions:  Your physician recommends that you continue on your current medications as directed. Please refer to the Current Medication list given to you today.  *If you need a refill on your cardiac medications before your next appointment, please call your pharmacy*  Follow-Up: At [facility], you and your health needs are our priority.  As part of our continuing mission to provide you with exceptional heart care, we have created designated Provider Care Teams.  These Care Teams include your primary Cardiologist (physician) and Advanced Practice Providers (APPs -  Physician Assistants and Nurse Practitioners) who all work together to provide you with the care you need, when you need it.  Your next appointment:   As needed  The format for your next appointment:   In Person  Provider:   Kristeen Miss, MD {  Other Instructions   1st Floor: - Lobby - Registration  - Pharmacy  - Lab - Cafe  2nd Floor: - PV Lab - Diagnostic Testing (echo, CT, nuclear med)  3rd Floor: - Vacant  4th Floor: - TCTS (cardiothoracic surgery) - AFib Clinic - Structural Heart Clinic - Vascular Surgery  - Vascular Ultrasound  5th Floor: - HeartCare Cardiology (general and EP) - Clinical Pharmacy for coumadin, hypertension, lipid, weight-loss medications, and med management appointments    Valet parking services will be available as well.

## 2023-08-26 ENCOUNTER — Ambulatory Visit: Payer: Medicare Other | Admitting: Clinical

## 2023-08-26 DIAGNOSIS — F3489 Other specified persistent mood disorders: Secondary | ICD-10-CM | POA: Diagnosis not present

## 2023-08-26 DIAGNOSIS — F331 Major depressive disorder, recurrent, moderate: Secondary | ICD-10-CM

## 2023-08-26 DIAGNOSIS — F900 Attention-deficit hyperactivity disorder, predominantly inattentive type: Secondary | ICD-10-CM | POA: Diagnosis not present

## 2023-08-26 DIAGNOSIS — F419 Anxiety disorder, unspecified: Secondary | ICD-10-CM | POA: Diagnosis not present

## 2023-08-26 DIAGNOSIS — F840 Autistic disorder: Secondary | ICD-10-CM | POA: Diagnosis not present

## 2023-08-26 NOTE — Progress Notes (Signed)
 Diagnosis: F84.0, F33.1 Time: 11:01 am-11:58 am CPT: 90837P  Felicia [name] was seen in person for individual therapy. 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 Session focused on an interaction at work where a coworker had expressed complaints and asked Felicia [name] for help. Therapist suggested communication strategies and offered validation and support. She is scheduled to be seen again in two weeks.  Objectives Related Problem: Felicia [name] experiences symptoms of depression related to difficulty connecting socially with others Description: Felicia [name] will increase her sense of well-being and connectedness and decrease symptoms of depression Target Date: 2024-05-03 Frequency: Biweekly Modality: individual Progress: 0% Planned Intervention: Therapist will provide referrals for additional resources as appropriate  Planned Intervention: Therapist will engage Felicia [name] in behavioral activation, including the incorporation of structure, pleasant events, and mastery activities into her routine  Planned Intervention: Therapist will help Felicia [name] to identify and disengage from maladaptive thought and behavior patterns using CBT-based strategies  Planned Intervention: Therapist will provide Felicia [name] with opportunities to process her experiences in session  Planned Intervention: Therapist will provide Felicia [name] with strategies to regulate her emotions, including self-care, mindfulness, meditation, and relaxation exercises  Treatment Plan Client Abilities/Strengths  Felicia [name] presents as motivated to improve her situation.  Client Treatment Preferences  None noted.  Client Statement of Needs  Felicia [name] is seeking CBT to manage symptoms of depression related to difficulty in social interactions.  Treatment Level  Biweekly  Symptoms  Autism: difficulty perceiving and responding to social cues, engaging in reciprocal  conversation (Status: maintained). Depression: sleep irregularity, depressed mood, tearfulness, poor self confidence, feelings of loneliness (Status: maintained).  Problems Addressed  New Description  Goals 1. Felicia [name] experiences symptoms of depression related to difficulty connecting socially with others Objective Felicia [name] will increase her sense of well-being and connectedness and decrease symptoms of depression Target Date: 2024-05-03 Frequency: Biweekly  Progress: 0 Modality: individual  Related Interventions Therapist will provide referrals for additional resources as appropriate Therapist will work with Felicia [name] to develop her social skills incorporating role-play exercises and video modeling, such as through the [employer] role play videos Therapist will engage Felicia [name] in behavioral activation, including the incorporation of structure, pleasant events, and mastery activities into her routine Therapist will provide Felicia [name] with strategies to regulate her emotions, including self-care, mindfulness, meditation, and relaxation exercises Therapist will help Felicia [name] to identify and disengage from maladaptive thought patterns using CBT-based strategies Therapist will provide Felicia [name] with opportunities to process her experiences in session Diagnosis Axis none 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 She was in her home in [place] and the therapist was in her office at the time of the appointment. Felicia [name] had recorded an example of  becoming frustrated with her friend, and was able to think of an example of a time when she felt appreciated. Therapist encouraged her to continue noticing when she  feels appreciated by others. Session focused on processing events in her last relationship. She is scheduled to be seen again in two weeks.  Objectives Related Problem: Foster experiences symptoms of depression related to difficulty connecting socially with others Description: Felicia [name] will increase her sense of well-being and connectedness and decrease symptoms of depression Target Date: 2023-05-04 Frequency: Biweekly Modality: individual Progress: 0% Planned Intervention: Therapist will provide referrals for additional resources as appropriate  Planned Intervention: Therapist will engage Felicia [name] in behavioral activation, including the incorporation of structure, pleasant events, and mastery activities into her routine  Planned Intervention: Therapist will help Felicia [name] to identify and disengage from maladaptive thought and behavior patterns using CBT-based strategies  Planned Intervention: Therapist will provide Felicia [name] with opportunities to process her experiences in session  Planned Intervention: Therapist will provide Felicia [name] with strategies to regulate her emotions, including self-care, mindfulness, meditation, and relaxation exercises  Treatment Plan Client Abilities/Strengths  Felicia [name] presents as motivated to improve her situation.  Client Treatment Preferences  None noted.  Client Statement of Needs  Felicia [name] is seeking CBT to manage symptoms of depression related to difficulty in social interactions.  Treatment Level  Biweekly  Symptoms  Autism: difficulty perceiving and responding to social cues, engaging in reciprocal conversation (Status: maintained). Depression: sleep irregularity, depressed mood, tearfulness, poor self confidence, feelings of loneliness (Status: maintained).  Problems Addressed  New Description  Goals 1. Felicia [name]  experiences symptoms of depression related to difficulty connecting socially with others Objective Merlin will increase her sense of well-being and connectedness and decrease symptoms of depression Target Date: 2023-05-04 Frequency: Biweekly  Progress: 0 Modality: individual  Related Interventions Therapist will provide referrals for additional resources as appropriate Therapist will work with Yanelie to develop her social skills incorporating role-play exercises and video modeling, such as through the [employer] role play videos Therapist will engage Miriah in behavioral activation, including the incorporation of structure, pleasant events, and mastery activities into her routine Therapist will provide Jakyla with strategies to regulate her emotions, including self-care, mindfulness, meditation, and relaxation exercises Therapist will help Eimy to identify and disengage from maladaptive thought patterns using CBT-based strategies Therapist will provide Idell with opportunities to process her experiences in session Diagnosis Axis none 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

## 2023-09-09 ENCOUNTER — Ambulatory Visit: Payer: Medicare Other | Admitting: Clinical

## 2023-09-09 DIAGNOSIS — F840 Autistic disorder: Secondary | ICD-10-CM | POA: Diagnosis not present

## 2023-09-09 DIAGNOSIS — F331 Major depressive disorder, recurrent, moderate: Secondary | ICD-10-CM | POA: Diagnosis not present

## 2023-09-09 NOTE — Progress Notes (Signed)
 Diagnosis: F84.0, F33.1 Time: 11:01 am-11:58 am CPT: 90837P  Felicia [name] was seen in person for individual therapy. She reported that she continues to enjoy her job, but has struggled in recent weeks with feelings of anger and a desire to lash out at others. Therapist engaged her in discussion of her anger, pointing out how she experiences a sense of empowerment through lashing out, and encouraged her to explore how she may feel empowered by staying calm. Therapist suggested several strategies, including clenching and releasing her toes in her shoes (PMR), deep breathing, and taking breaks to use calming strategies where possible. Therapist also suggested repeating to herself, "respond, don't react," and "don't let the anger take control." She is scheduled to be seen again in two weeks.  Objectives Related Problem: Felicia [name] experiences symptoms of depression related to difficulty connecting socially with others Description: Felicia [name] will increase her sense of well-being and connectedness and decrease symptoms of depression Target Date: 2024-05-03 Frequency: Biweekly Modality: individual Progress: 0% Planned Intervention: Therapist will provide referrals for additional resources as appropriate  Planned Intervention: Therapist will engage Felicia [name] in behavioral activation, including the incorporation of structure, pleasant events, and mastery activities into her routine  Planned Intervention: Therapist will help Felicia [name] to identify and disengage from maladaptive thought and behavior patterns using CBT-based strategies  Planned Intervention: Therapist will provide Felicia [name] with opportunities to process her experiences in session  Planned Intervention: Therapist will provide Felicia [name] with strategies to regulate her emotions, including self-care, mindfulness, meditation, and relaxation exercises  Treatment Plan Client Abilities/Strengths  Felicia [name] presents as motivated to improve her situation.  Client Treatment Preferences   None noted.  Client Statement of Needs  Felicia [name] is seeking CBT to manage symptoms of depression related to difficulty in social interactions.  Treatment Level  Biweekly  Symptoms  Autism: difficulty perceiving and responding to social cues, engaging in reciprocal conversation (Status: maintained). Depression: sleep irregularity, depressed mood, tearfulness, poor self confidence, feelings of loneliness (Status: maintained).  Problems Addressed  New Description  Goals 1. Felicia [name] experiences symptoms of depression related to difficulty connecting socially with others Objective Felicia [name] will increase her sense of well-being and connectedness and decrease symptoms of depression Target Date: 2024-05-03 Frequency: Biweekly  Progress: 0 Modality: individual  Related Interventions Therapist will provide referrals for additional resources as appropriate Therapist will work with Felicia [name] to develop her social skills incorporating role-play exercises and video modeling, such as through the [employer] role play videos Therapist will engage Felicia [name] in behavioral activation, including the incorporation of structure, pleasant events, and mastery activities into her routine Therapist will provide Felicia [name] with strategies to regulate her emotions, including self-care, mindfulness, meditation, and relaxation exercises Therapist will help Felicia [name] to identify and disengage from maladaptive thought patterns using CBT-based strategies Therapist will provide Felicia [name] with opportunities to process her experiences in session Diagnosis Axis none 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 She was in her home in [place]  and the therapist was in her office at the time of the appointment. Felicia [name] had recorded an example of becoming frustrated with her friend, and was able to think of an example of a time when she felt appreciated. Therapist encouraged her to continue noticing when she feels appreciated by others. Session focused on processing events in her last relationship. She is scheduled to be seen again in two weeks.  Objectives Related Problem: Felicia [name] experiences symptoms of depression related to difficulty connecting socially with others Description: Felicia [name] will increase her sense of well-being and connectedness and decrease symptoms of depression Target Date: 2023-05-04 Frequency: Biweekly Modality: individual Progress: 0% Planned Intervention: Therapist will provide referrals for additional resources as appropriate  Planned Intervention: Therapist will engage Felicia [name] in behavioral activation, including the incorporation of structure, pleasant events, and mastery activities into her routine  Planned Intervention: Therapist will help Felicia [name] to identify and disengage from maladaptive thought and behavior patterns using CBT-based strategies  Planned Intervention: Therapist will provide [name] with opportunities to process her experiences in session  Planned Intervention: Therapist will provide Felicia [name] with strategies to regulate her emotions, including self-care, mindfulness, meditation, and relaxation exercises  Treatment Plan Client Abilities/Strengths  Felicia [name] presents as motivated to improve her situation.  Client Treatment Preferences  None noted.  Client Statement of Needs  Felicia [name] is seeking CBT to manage symptoms of depression related to difficulty in social interactions.  Treatment Level  Biweekly  Symptoms  Autism: difficulty perceiving  and responding to social cues, engaging in reciprocal conversation (Status: maintained). Depression: sleep irregularity, depressed mood, tearfulness, poor self confidence, feelings of loneliness (Status: maintained).  Problems Addressed  New Description  Goals 1. Latresa experiences symptoms of depression related to difficulty connecting socially with others Objective Mckenleigh will increase her sense of well-being and connectedness and decrease symptoms of depression Target Date: 2023-05-04 Frequency: Biweekly  Progress: 0 Modality: individual  Related Interventions Therapist will provide referrals for additional resources as appropriate Therapist will work with Adelai to develop her social skills incorporating role-play exercises and video modeling, such as through the [employer] role play videos Therapist will engage Mercede in behavioral activation, including the incorporation of structure, pleasant events, and mastery activities into her routine Therapist will provide Tamisha with strategies to regulate her emotions, including self-care, mindfulness, meditation, and relaxation exercises Therapist will help Janye to identify and disengage from maladaptive thought patterns using CBT-based strategies Therapist will provide Sherlie with opportunities to process her experiences in session Diagnosis Axis none 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

## 2023-09-11 ENCOUNTER — Ambulatory Visit: Payer: Medicare Other | Admitting: Cardiovascular Disease

## 2023-09-23 ENCOUNTER — Ambulatory Visit: Payer: Medicare Other | Admitting: Clinical

## 2023-09-23 DIAGNOSIS — F331 Major depressive disorder, recurrent, moderate: Secondary | ICD-10-CM | POA: Diagnosis not present

## 2023-09-23 DIAGNOSIS — F840 Autistic disorder: Secondary | ICD-10-CM | POA: Diagnosis not present

## 2023-09-23 NOTE — Progress Notes (Signed)
 Diagnosis: F84.0, F33.1 Time: 11:01 am-11:58 am CPT: 90837P  Felicia [name] was seen in person for individual therapy. She reported feelings of overwhelm and difficulty relaxing related to several upcoming events. Therapist engaged her in discussion of identifying her priorities and streamlining what she needs to focus on in the immediate future. She is scheduled to be seen again in two weeks.  Objectives Related Problem: Felicia [name] experiences symptoms of depression related to difficulty connecting socially with others Description: Felicia [name] will increase her sense of well-being and connectedness and decrease symptoms of depression Target Date: 2024-05-03 Frequency: Biweekly Modality: individual Progress: 0% Planned Intervention: Therapist will provide referrals for additional resources as appropriate  Planned Intervention: Therapist will engage Felicia [name] in behavioral activation, including the incorporation of structure, pleasant events, and mastery activities into her routine  Planned Intervention: Therapist will help Felicia [name] to identify and disengage from maladaptive thought and behavior patterns using CBT-based strategies  Planned Intervention: Therapist will provide Felicia [name] with opportunities to process her experiences in session  Planned Intervention: Therapist will provide Felicia [name] with strategies to regulate her emotions, including self-care, mindfulness, meditation, and relaxation exercises  Treatment Plan Client Abilities/Strengths  Felicia [name] presents as motivated to improve her situation.  Client Treatment Preferences  None noted.  Client Statement of Needs  Felicia [name] is seeking CBT to manage symptoms of depression related to difficulty in social interactions.  Treatment Level  Biweekly  Symptoms  Autism: difficulty perceiving and responding to social cues, engaging in reciprocal conversation (Status: maintained). Depression: sleep irregularity, depressed mood, tearfulness, poor self confidence, feelings of  loneliness (Status: maintained).  Problems Addressed  New Description  Goals 1. Felicia [name] experiences symptoms of depression related to difficulty connecting socially with others Objective Felicia [name] will increase her sense of well-being and connectedness and decrease symptoms of depression Target Date: 2024-05-03 Frequency: Biweekly  Progress: 0 Modality: individual  Related Interventions Therapist will provide referrals for additional resources as appropriate Therapist will work with Felicia [name] to develop her social skills incorporating role-play exercises and video modeling, such as through the [employer] role play videos Therapist will engage Felicia [name] in behavioral activation, including the incorporation of structure, pleasant events, and mastery activities into her routine Therapist will provide Felicia [name] with strategies to regulate her emotions, including self-care, mindfulness, meditation, and relaxation exercises Therapist will help Felicia [name] to identify and disengage from maladaptive thought patterns using CBT-based strategies Therapist will provide Felicia [name] with opportunities to process her experiences in session Diagnosis Axis none 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 She was in her home in [place] and the therapist was in her office at the time of the appointment. Felicia [name] had recorded an example of becoming frustrated with her friend, and was able to think of an example of a time when she felt appreciated. Therapist encouraged  her to continue noticing when she feels appreciated by others. Session focused on processing events in her last relationship. She is scheduled to be seen again  in two weeks.  Objectives Related Problem: Felicia [name] experiences symptoms of depression related to difficulty connecting socially with others Description: Felicia [name] will increase her sense of well-being and connectedness and decrease symptoms of depression Target Date: 2023-05-04 Frequency: Biweekly Modality: individual Progress: 0% Planned Intervention: Therapist will provide referrals for additional resources as appropriate  Planned Intervention: Therapist will engage Felicia [name] in behavioral activation, including the incorporation of structure, pleasant events, and mastery activities into her routine  Planned Intervention: Therapist will help Felicia [name] to identify and disengage from maladaptive thought and behavior patterns using CBT-based strategies  Planned Intervention: Therapist will provide Felicia [name] with opportunities to process her experiences in session  Planned Intervention: Therapist will provide Felicia [name] with strategies to regulate her emotions, including self-care, mindfulness, meditation, and relaxation exercises  Treatment Plan Client Abilities/Strengths  Felicia [name] presents as motivated to improve her situation.  Client Treatment Preferences  None noted.  Client Statement of Needs  Felicia [name] is seeking CBT to manage symptoms of depression related to difficulty in social interactions.  Treatment Level  Biweekly  Symptoms  Autism: difficulty perceiving and responding to social cues, engaging in reciprocal conversation (Status: maintained). Depression: sleep irregularity, depressed mood, tearfulness, poor self confidence, feelings of loneliness (Status: maintained).  Problems Addressed  New Description  Goals 1. Felicia [name] experiences symptoms of depression related to difficulty connecting socially with others Objective Felicia [name] will increase her sense of  well-being and connectedness and decrease symptoms of depression Target Date: 2023-05-04 Frequency: Biweekly  Progress: 0 Modality: individual  Related Interventions Therapist will provide referrals for additional resources as appropriate Therapist will work with Felicia [name] to develop her social skills incorporating role-play exercises and video modeling, such as through the [employer] role play videos Therapist will engage Felicia [name] in behavioral activation, including the incorporation of structure, pleasant events, and mastery activities into her routine Therapist will provide Felicia [name] with strategies to regulate her emotions, including self-care, mindfulness, meditation, and relaxation exercises Therapist will help Felicia [name] to identify and disengage from maladaptive thought patterns using CBT-based strategies Therapist will provide Felicia [name] with opportunities to process her experiences in session Diagnosis Axis none 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

## 2023-10-07 ENCOUNTER — Ambulatory Visit: Payer: Medicare Other | Admitting: Clinical

## 2023-10-07 DIAGNOSIS — F331 Major depressive disorder, recurrent, moderate: Secondary | ICD-10-CM

## 2023-10-07 DIAGNOSIS — F840 Autistic disorder: Secondary | ICD-10-CM | POA: Diagnosis not present

## 2023-10-07 NOTE — Progress Notes (Signed)
 Diagnosis: F84.0, F33.1 Time: 11:01 am-11:58 am CPT: 90837P  Felicia [name] was seen in person for individual therapy. Session focused on work-related stress in regard to her schedule and a conflict with a coworker. Therapist suggested assertive communication, and engaged her in discussion of communication strategies. She is scheduled to be seen again in two weeks.  Objectives Related Problem: Felicia [name] experiences symptoms of depression related to difficulty connecting socially with others Description: Felicia [name] will increase her sense of well-being and connectedness and decrease symptoms of depression Target Date: 2024-05-03 Frequency: Biweekly Modality: individual Progress: 0% Planned Intervention: Therapist will provide referrals for additional resources as appropriate  Planned Intervention: Therapist will engage Felicia [name] in behavioral activation, including the incorporation of structure, pleasant events, and mastery activities into her routine  Planned Intervention: Therapist will help Felicia [name] to identify and disengage from maladaptive thought and behavior patterns using CBT-based strategies  Planned Intervention: Therapist will provide Felicia [name] with opportunities to process her experiences in session  Planned Intervention: Therapist will provide Felicia [name] with strategies to regulate her emotions, including self-care, mindfulness, meditation, and relaxation exercises  Treatment Plan Client Abilities/Strengths  Felicia [name] presents as motivated to improve her situation.  Client Treatment Preferences  None noted.  Client Statement of Needs  Felicia [name] is seeking CBT to manage symptoms of depression related to difficulty in social interactions.  Treatment Level  Biweekly  Symptoms  Autism: difficulty perceiving and responding to social cues, engaging in reciprocal conversation (Status: maintained). Depression: sleep irregularity, depressed mood, tearfulness, poor self confidence, feelings of loneliness (Status: maintained).   Problems Addressed  New Description  Goals 1. Felicia [name] experiences symptoms of depression related to difficulty connecting socially with others Objective Felicia [name] will increase her sense of well-being and connectedness and decrease symptoms of depression Target Date: 2024-05-03 Frequency: Biweekly  Progress: 0 Modality: individual  Related Interventions Therapist will provide referrals for additional resources as appropriate Therapist will work with Felicia [name] to develop her social skills incorporating role-play exercises and video modeling, such as through the [employer] role play videos Therapist will engage Felicia [name] in behavioral activation, including the incorporation of structure, pleasant events, and mastery activities into her routine Therapist will provide [place]icia [name] with strategies to regulate her emotions, including self-care, mindfulness, meditation, and relaxation exercises Therapist will help Felicia [name] to identify and disengage from maladaptive thought patterns using CBT-based strategies Therapist will provide Felicia [name] with opportunities to process her experiences in session Diagnosis Axis none 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 She was in her home in [name]  and the therapist was in her office at the time of the appointment. Felicia [name] had recorded an example of becoming frustrated with her friend, and was able to think of an example of a time when she felt appreciated. Therapist encouraged her to continue noticing when she  feels appreciated by others. Session focused on processing events in her last relationship. She is scheduled to be seen again in two weeks.  Objectives Related  Problem: Felicia [name] experiences symptoms of depression related to difficulty connecting socially with others Description: Felicia [name] will increase her sense of well-being and connectedness and decrease symptoms of depression Target Date: 2023-05-04 Frequency: Biweekly Modality: individual Progress: 0% Planned Intervention: Therapist will provide referrals for additional resources as appropriate  Planned Intervention: Therapist will engage Felicia [name] in behavioral activation, including the incorporation of structure, pleasant events, and mastery activities into her routine  Planned Intervention: Therapist will help Felicia [name] to identify and disengage from maladaptive thought and behavior patterns using CBT-based strategies  Planned Intervention: Therapist will provide Felicia [name] with opportunities to process her experiences in session  Planned Intervention: Therapist will provide Felicia [name] with strategies to regulate her emotions, including self-care, mindfulness, meditation, and relaxation exercises  Treatment Plan Client Abilities/Strengths  Felicia [name] presents as motivated to improve her situation.  Client Treatment Preferences  None noted.  Client Statement of Needs  Felicia [name] is seeking CBT to manage symptoms of depression related to difficulty in social interactions.  Treatment Level  Biweekly  Symptoms  Autism: difficulty perceiving and responding to social cues, engaging in reciprocal conversation (Status: maintained). Depression: sleep irregularity, depressed mood, tearfulness, poor self confidence, feelings of loneliness (Status: maintained).  Problems Addressed  New Description  Goals 1. Felicia [name] experiences symptoms of depression related to difficulty connecting socially with others Objective Felicia [name] will increase her sense of well-being and connectedness and  decrease symptoms of depression Target Date: 2023-05-04 Frequency: Biweekly  Progress: 0 Modality: individual  Related Interventions Therapist will provide referrals for additional resources as appropriate Therapist will work with Librada to develop her social skills incorporating role-play exercises and video modeling, such as through the [employer] role play videos Therapist will engage Pamelia in behavioral activation, including the incorporation of structure, pleasant events, and mastery activities into her routine Therapist will provide Rozlyn with strategies to regulate her emotions, including self-care, mindfulness, meditation, and relaxation exercises Therapist will help Raylie to identify and disengage from maladaptive thought patterns using CBT-based strategies Therapist will provide Kayleanna with opportunities to process her experiences in session Diagnosis Axis none 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

## 2023-10-21 ENCOUNTER — Ambulatory Visit: Payer: Medicare Other | Admitting: Clinical

## 2023-10-21 DIAGNOSIS — F331 Major depressive disorder, recurrent, moderate: Secondary | ICD-10-CM

## 2023-10-21 DIAGNOSIS — F840 Autistic disorder: Secondary | ICD-10-CM | POA: Diagnosis not present

## 2023-10-21 NOTE — Progress Notes (Signed)
 Diagnosis: F84.0, F33.1 Time: 11:01 am-11:58 am CPT: 90837P  Felicia [name] was seen in person for individual therapy. Session focused on work-related stress in regard to her schedule and feelings of burnout. Therapist engaged her in discussion of how to firmly and clearly communicate her availability, and encouraged her to consider how much she is open to working in June and communicate this now. She is scheduled to be seen again in one month.  Objectives Related Problem: Felicia [name] experiences symptoms of depression related to difficulty connecting socially with others Description: Felicia [name] will increase her sense of well-being and connectedness and decrease symptoms of depression Target Date: 2024-05-03 Frequency: Biweekly Modality: individual Progress: 0% Planned Intervention: Therapist will provide referrals for additional resources as appropriate  Planned Intervention: Therapist will engage [place]icia [name] in behavioral activation, including the incorporation of structure, pleasant events, and mastery activities into her routine  Planned Intervention: Therapist will help Apoorva to identify and disengage from maladaptive thought and behavior patterns using CBT-based strategies  Planned Intervention: Therapist will provide Felicia [name] with opportunities to process her experiences in session  Planned Intervention: Therapist will provide Felicia [name] with strategies to regulate her emotions, including self-care, mindfulness, meditation, and relaxation exercises  Treatment Plan Client Abilities/Strengths  Felicia [name] presents as motivated to improve her situation.  Client Treatment Preferences  None noted.  Client Statement of Needs  Felicia [name] is seeking CBT to manage symptoms of depression related to difficulty in social interactions.  Treatment Level  Biweekly  Symptoms  Autism: difficulty perceiving and responding to social cues, engaging in reciprocal conversation (Status: maintained). Depression: sleep irregularity, depressed mood,  tearfulness, poor self confidence, feelings of loneliness (Status: maintained).  Problems Addressed  New Description  Goals 1. Malayzia experiences symptoms of depression related to difficulty connecting socially with others Objective Felicia [name] will increase her sense of well-being and connectedness and decrease symptoms of depression Target Date: 2024-05-03 Frequency: Biweekly  Progress: 0 Modality: individual  Related Interventions Therapist will provide referrals for additional resources as appropriate Therapist will work with Felicia [name] to develop her social skills incorporating role-play exercises and video modeling, such as through the [employer] role play videos Therapist will engage Felicia [name] in behavioral activation, including the incorporation of structure, pleasant events, and mastery activities into her routine Therapist will provide [place]icia [name] with strategies to regulate her emotions, including self-care, mindfulness, meditation, and relaxation exercises Therapist will help Felicia [name] to identify and disengage from maladaptive thought patterns using CBT-based strategies Therapist will provide [place]icia [name] with opportunities to process her experiences in session Diagnosis Axis none 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 She was in her home in [name]  and the therapist was in her office at the time of the appointment. Felicia [name] had recorded an example of becoming frustrated with her friend, and was able to think of an example of a time  when she felt appreciated. Therapist encouraged her to continue noticing when she feels appreciated by others. Session focused on processing  events in her last relationship. She is scheduled to be seen again in two weeks.  Objectives Related Problem: Felicia [name] experiences symptoms of depression related to difficulty connecting socially with others Description: Felicia [name] will increase her sense of well-being and connectedness and decrease symptoms of depression Target Date: 2023-05-04 Frequency: Biweekly Modality: individual Progress: 0% Planned Intervention: Therapist will provide referrals for additional resources as appropriate  Planned Intervention: Therapist will engage [place]icia [name] in behavioral activation, including the incorporation of structure, pleasant events, and mastery activities into her routine  Planned Intervention: Therapist will help [place]icia [name] to identify and disengage from maladaptive thought and behavior patterns using CBT-based strategies  Planned Intervention: Therapist will provide [place]icia [name] with opportunities to process her experiences in session  Planned Intervention: Therapist will provide [place]icia [name] with strategies to regulate her emotions, including self-care, mindfulness, meditation, and relaxation exercises  Treatment Plan Client Abilities/Strengths  Anyela presents as motivated to improve her situation.  Client Treatment Preferences  None noted.  Client Statement of Needs  Felicia [name] is seeking CBT to manage symptoms of depression related to difficulty in social interactions.  Treatment Level  Biweekly  Symptoms  Autism: difficulty perceiving and responding to social cues, engaging in reciprocal conversation (Status: maintained). Depression: sleep irregularity, depressed mood, tearfulness, poor self confidence, feelings of loneliness (Status: maintained).  Problems Addressed  New Description  Goals 1. Felicia [name] experiences symptoms of depression related to difficulty connecting socially with  others Objective [place]icia [name] will increase her sense of well-being and connectedness and decrease symptoms of depression Target Date: 2023-05-04 Frequency: Biweekly  Progress: 0 Modality: individual  Related Interventions Therapist will provide referrals for additional resources as appropriate Therapist will work with [place]icia [name] to develop her social skills incorporating role-play exercises and video modeling, such as through the [employer] role play videos Therapist will engage [place]icia [name] in behavioral activation, including the incorporation of structure, pleasant events, and mastery activities into her routine Therapist will provide [place]icia [name] with strategies to regulate her emotions, including self-care, mindfulness, meditation, and relaxation exercises Therapist will help [place]icia [name] to identify and disengage from maladaptive thought patterns using CBT-based strategies Therapist will provide Shereena with opportunities to process her experiences in session Diagnosis Axis none 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

## 2023-11-18 ENCOUNTER — Ambulatory Visit: Admitting: Clinical

## 2023-11-18 DIAGNOSIS — F331 Major depressive disorder, recurrent, moderate: Secondary | ICD-10-CM | POA: Diagnosis not present

## 2023-11-18 DIAGNOSIS — F840 Autistic disorder: Secondary | ICD-10-CM | POA: Diagnosis not present

## 2023-11-18 NOTE — Progress Notes (Signed)
 Diagnosis: F84.0, F33.1 Time: 11:01 am-11:56 am CPT: 90837P  Felicia [name] was seen in person for individual therapy. Session focused on a conflict that had arisen with a friend. Therapist offered validation and support, and explored communication strategies she could use with her. She is scheduled to be seen again in two weeks.  Objectives Related Problem: Felicia [name] experiences symptoms of depression related to difficulty connecting socially with others Description: Felicia [name] will increase her sense of well-being and connectedness and decrease symptoms of depression Target Date: 2024-05-03 Frequency: Biweekly Modality: individual Progress: 0% Planned Intervention: Therapist will provide referrals for additional resources as appropriate  Planned Intervention: Therapist will engage [place]icia [name] in behavioral activation, including the incorporation of structure, pleasant events, and mastery activities into her routine  Planned Intervention: Therapist will help [place]icia [name] to identify and disengage from maladaptive thought and behavior patterns using CBT-based strategies  Planned Intervention: Therapist will provide [place]icia [name] with opportunities to process her experiences in session  Planned Intervention: Therapist will provide [place]icia [name] with strategies to regulate her emotions, including self-care, mindfulness, meditation, and relaxation exercises  Treatment Plan Client Abilities/Strengths  Felicia [name] presents as motivated to improve her situation.  Client Treatment Preferences  None noted.  Client Statement of Needs  Felicia [name] is seeking CBT to manage symptoms of depression related to difficulty in social interactions.  Treatment Level  Biweekly  Symptoms  Autism: difficulty perceiving and responding to social cues, engaging in reciprocal conversation (Status: maintained). Depression: sleep irregularity, depressed mood, tearfulness, poor self confidence, feelings of loneliness (Status: maintained).  Problems Addressed  New  Description  Goals 1. Felicia [name] experiences symptoms of depression related to difficulty connecting socially with others Objective Felicia [name] will increase her sense of well-being and connectedness and decrease symptoms of depression Target Date: 2024-05-03 Frequency: Biweekly  Progress: 0 Modality: individual  Related Interventions Therapist will provide referrals for additional resources as appropriate Therapist will work with Felicia [name] to develop her social skills incorporating role-play exercises and video modeling, such as through the [employer] role play videos Therapist will engage [place]icia [name] in behavioral activation, including the incorporation of structure, pleasant events, and mastery activities into her routine Therapist will provide [place]icia [name] with strategies to regulate her emotions, including self-care, mindfulness, meditation, and relaxation exercises Therapist will help [place]icia [name] to identify and disengage from maladaptive thought patterns using CBT-based strategies Therapist will provide [place]icia [name] with opportunities to process her experiences in session Diagnosis Axis none 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 She was in her home in [place]  and the therapist was in her office at the time of the appointment. Felicia [name] had recorded an example of becoming frustrated with her friend, and was able to think of an example of a time when she felt appreciated. Therapist encouraged her to continue noticing when she feels appreciated by  others. Session focused on processing events in her last relationship. She is scheduled to be seen again in two weeks.  Objectives Related Problem: Felicia [name] experiences  symptoms of depression related to difficulty connecting socially with others Description: Felicia [name] will increase her sense of well-being and connectedness and decrease symptoms of depression Target Date: 2023-05-04 Frequency: Biweekly Modality: individual Progress: 0% Planned Intervention: Therapist will provide referrals for additional resources as appropriate  Planned Intervention: Therapist will engage [place]icia [name] in behavioral activation, including the incorporation of structure, pleasant events, and mastery activities into her routine  Planned Intervention: Therapist will help [place]icia [name] to identify and disengage from maladaptive thought and behavior patterns using CBT-based strategies  Planned Intervention: Therapist will provide [place]icia [name] with opportunities to process her experiences in session  Planned Intervention: Therapist will provide Felicia [name] with strategies to regulate her emotions, including self-care, mindfulness, meditation, and relaxation exercises  Treatment Plan Client Abilities/Strengths  Felicia [name] presents as motivated to improve her situation.  Client Treatment Preferences  None noted.  Client Statement of Needs  Felicia [name] is seeking CBT to manage symptoms of depression related to difficulty in social interactions.  Treatment Level  Biweekly  Symptoms  Autism: difficulty perceiving and responding to social cues, engaging in reciprocal conversation (Status: maintained). Depression: sleep irregularity, depressed mood, tearfulness, poor self confidence, feelings of loneliness (Status: maintained).  Problems Addressed  New Description  Goals 1. Felicia [name] experiences symptoms of depression related to difficulty connecting socially with others Objective Felicia [name] will increase her sense of well-being and connectedness and decrease symptoms of  depression Target Date: 2023-05-04 Frequency: Biweekly  Progress: 0 Modality: individual  Related Interventions Therapist will provide referrals for additional resources as appropriate Therapist will work with Felicia [name] to develop her social skills incorporating role-play exercises and video modeling, such as through the [employer] role play videos Therapist will engage [place]icia [name] in behavioral activation, including the incorporation of structure, pleasant events, and mastery activities into her routine Therapist will provide Amiley with strategies to regulate her emotions, including self-care, mindfulness, meditation, and relaxation exercises Therapist will help Mily to identify and disengage from maladaptive thought patterns using CBT-based strategies Therapist will provide Julya with opportunities to process her experiences in session Diagnosis Axis none 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

## 2023-11-25 DIAGNOSIS — F419 Anxiety disorder, unspecified: Secondary | ICD-10-CM | POA: Diagnosis not present

## 2023-11-25 DIAGNOSIS — F900 Attention-deficit hyperactivity disorder, predominantly inattentive type: Secondary | ICD-10-CM | POA: Diagnosis not present

## 2023-11-25 DIAGNOSIS — F3489 Other specified persistent mood disorders: Secondary | ICD-10-CM | POA: Diagnosis not present

## 2023-12-02 ENCOUNTER — Ambulatory Visit: Admitting: Clinical

## 2023-12-02 DIAGNOSIS — F331 Major depressive disorder, recurrent, moderate: Secondary | ICD-10-CM

## 2023-12-02 DIAGNOSIS — F840 Autistic disorder: Secondary | ICD-10-CM | POA: Diagnosis not present

## 2023-12-02 NOTE — Progress Notes (Signed)
 Diagnosis: F84.0, F33.1 Time: 11:01 am-11:56 am CPT: 90837P  Felicia [name] was seen in person for therapy. Session focused on developments in her conflict with a friend. Therapist suggested communication strategies and alternate perspectives. Felicia [name] also expressed a growing sense of burnout at work. Therapist engaged her in discussion of how she might manage her time, along with communication strategies for the workplace. She is scheduled to be seen again in two weeks.  Objectives Related Problem: Felicia [name] experiences symptoms of depression related to difficulty connecting socially with others Description: Felicia [name] will increase her sense of well-being and connectedness and decrease symptoms of depression Target Date: 2024-05-03 Frequency: Biweekly Modality: individual Progress: 0% Planned Intervention: Therapist will provide referrals for additional resources as appropriate  Planned Intervention: Therapist will engage Felicia [name] in behavioral activation, including the incorporation of structure, pleasant events, and mastery activities into her routine  Planned Intervention: Therapist will help Felicia [name] to identify and disengage from maladaptive thought and behavior patterns using CBT-based strategies  Planned Intervention: Therapist will provide Felicia [name] with opportunities to process her experiences in session  Planned Intervention: Therapist will provide Felicia [name] with strategies to regulate her emotions, including self-care, mindfulness, meditation, and relaxation exercises  Treatment Plan Client Abilities/Strengths  Felicia [name] presents as motivated to improve her situation.  Client Treatment Preferences  None noted.  Client Statement of Needs  Felicia [name] is seeking CBT to manage symptoms of depression related to difficulty in social interactions.  Treatment Level  Biweekly  Symptoms  Autism: difficulty perceiving and responding to social cues, engaging in reciprocal conversation (Status: maintained). Depression: sleep  irregularity, depressed mood, tearfulness, poor self confidence, feelings of loneliness (Status: maintained).  Problems Addressed  New Description  Goals 1. Felicia [name] experiences symptoms of depression related to difficulty connecting socially with others Objective Felicia [name] will increase her sense of well-being and connectedness and decrease symptoms of depression Target Date: 2024-05-03 Frequency: Biweekly  Progress: 0 Modality: individual  Related Interventions Therapist will provide referrals for additional resources as appropriate Therapist will work with Felicia [name] to develop her social skills incorporating role-play exercises and video modeling, such as through the [employer] role play videos Therapist will engage Felicia [name] in behavioral activation, including the incorporation of structure, pleasant events, and mastery activities into her routine Therapist will provide Felicia [name] with strategies to regulate her emotions, including self-care, mindfulness, meditation, and relaxation exercises Therapist will help Felicia [name] to identify and disengage from maladaptive thought patterns using CBT-based strategies Therapist will provide Felicia [name] with opportunities to process her experiences in session Diagnosis Axis none 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 She was in her home in [place]  and the therapist was in her office at the time of the appointment. Felicia [name] had recorded an example of becoming frustrated with her friend, and was able to  think of an example of a time when she felt appreciated. Therapist encouraged her to continue noticing when she feels appreciated by others. Session focused on processing events  in her last relationship. She is scheduled to be seen again in two weeks.  Objectives Related Problem: Felicia [name] experiences symptoms of depression related to difficulty connecting socially with others Description: Felicia [name] will increase her sense of well-being and connectedness and decrease symptoms of depression Target Date: 2023-05-04 Frequency: Biweekly Modality: individual Progress: 0% Planned Intervention: Therapist will provide referrals for additional resources as appropriate  Planned Intervention: Therapist will engage Najat in behavioral activation, including the incorporation of structure, pleasant events, and mastery activities into her routine  Planned Intervention: Therapist will help Shelia to identify and disengage from maladaptive thought and behavior patterns using CBT-based strategies  Planned Intervention: Therapist will provide Felicia [name] with opportunities to process her experiences in session  Planned Intervention: Therapist will provide Felicia [name] with strategies to regulate her emotions, including self-care, mindfulness, meditation, and relaxation exercises  Treatment Plan Client Abilities/Strengths  Felicia [name] presents as motivated to improve her situation.  Client Treatment Preferences  None noted.  Client Statement of Needs  Felicia [name] is seeking CBT to manage symptoms of depression related to difficulty in social interactions.  Treatment Level  Biweekly  Symptoms  Autism: difficulty perceiving and responding to social cues, engaging in reciprocal conversation (Status: maintained). Depression: sleep irregularity, depressed mood, tearfulness, poor self confidence, feelings of loneliness (Status: maintained).  Problems Addressed  New Description  Goals 1. Felicia [name] experiences symptoms of depression related to  difficulty connecting socially with others Objective Felicia [name] will increase her sense of well-being and connectedness and decrease symptoms of depression Target Date: 2023-05-04 Frequency: Biweekly  Progress: 0 Modality: individual  Related Interventions Therapist will provide referrals for additional resources as appropriate Therapist will work with Yana to develop her social skills incorporating role-play exercises and video modeling, such as through the [employer] role play videos Therapist will engage Kemba in behavioral activation, including the incorporation of structure, pleasant events, and mastery activities into her routine Therapist will provide Eddie with strategies to regulate her emotions, including self-care, mindfulness, meditation, and relaxation exercises Therapist will help Ledia to identify and disengage from maladaptive thought patterns using CBT-based strategies Therapist will provide Avry with opportunities to process her experiences in session Diagnosis Axis none 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

## 2023-12-09 DIAGNOSIS — K080 Exfoliation of teeth due to systemic causes: Secondary | ICD-10-CM | POA: Diagnosis not present

## 2023-12-16 ENCOUNTER — Ambulatory Visit: Admitting: Clinical

## 2023-12-16 DIAGNOSIS — F840 Autistic disorder: Secondary | ICD-10-CM

## 2023-12-16 DIAGNOSIS — F331 Major depressive disorder, recurrent, moderate: Secondary | ICD-10-CM | POA: Diagnosis not present

## 2023-12-16 NOTE — Progress Notes (Signed)
 Diagnosis: [name], Felicia [name]01 am-[name]56 am CPT: 90837P  Felicia [name] was seen in person for therapy. She reported upon a good two weeks overall, characterized by stable mood and several positive developments in friendship. She expressed conflicted feelings about going to the beach due to fears of infectious amoebas, and therapist engaged her in thought testing and consideration of the risks vs benefits of going. She is scheduled to be seen again in two weeks.  Objectives Related Problem: Felicia [name] experiences symptoms of depression related to difficulty connecting socially with others Description: Felicia [name] will increase her sense of well-being and connectedness and decrease symptoms of depression Target Date: 2024-05-03 Frequency: Biweekly Modality: individual Progress: 0% Planned Intervention: Therapist will provide referrals for additional resources as appropriate  Planned Intervention: Therapist will engage Felicia [name] in behavioral activation, including the incorporation of structure, pleasant events, and mastery activities into her routine  Planned Intervention: Therapist will help Felicia [name] to identify and disengage from maladaptive thought and behavior patterns using CBT-based strategies  Planned Intervention: Therapist will provide Felicia [name] with opportunities to process her experiences in session  Planned Intervention: Therapist will provide Felicia [name] with strategies to regulate her emotions, including self-care, mindfulness, meditation, and relaxation exercises  Treatment Plan Felicia [name] Abilities/Strengths  Felicia [name] presents as motivated to improve her situation.  Felicia [name] Treatment Preferences  None noted.  Felicia [name] Statement of Needs  Felicia [name] is seeking CBT to manage symptoms of depression related to difficulty in social interactions.  Treatment Level  Biweekly  Symptoms  Autism: difficulty perceiving and responding to social cues, engaging in reciprocal conversation (Status: maintained). Depression: sleep  irregularity, depressed mood, tearfulness, poor self confidence, feelings of loneliness (Status: maintained).  Problems Addressed  New Description  Goals 1. Felicia [name] experiences symptoms of depression related to difficulty connecting socially with others Objective Felicia [name] will increase her sense of well-being and connectedness and decrease symptoms of depression Target Date: 2024-05-03 Frequency: Biweekly  Progress: 0 Modality: individual  Related Interventions Therapist will provide referrals for additional resources as appropriate Therapist will work with Felicia [name] to develop her social skills incorporating role-play exercises and video modeling, such as through the [employer] role play videos Therapist will engage Felicia [name] in behavioral activation, including the incorporation of structure, pleasant events, and mastery activities into her routine Therapist will provide Felicia [name] with strategies to regulate her emotions, including self-care, mindfulness, meditation, and relaxation exercises Therapist will help Felicia [name] to identify and disengage from maladaptive thought patterns using CBT-based strategies Therapist will provide Felicia [name] with opportunities to process her experiences in session Diagnosis Axis none 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 She was in her home in [place]  and the therapist was in her office at the time of the appointment. Felicia [name] had recorded an example of becoming frustrated with her friend, and was able to  think of an example of a time when she felt appreciated. Therapist encouraged her to continue noticing when she feels appreciated by others. Session focused  on processing events in her last relationship. She is scheduled to be seen again in two weeks.  Objectives Related Problem: Felicia [name] experiences symptoms of depression related to difficulty connecting socially with others Description: Felicia [name] will increase her sense of well-being and connectedness and decrease symptoms of depression Target Date: 2023-05-04 Frequency: Biweekly Modality: individual Progress: 0% Planned Intervention: Therapist will provide referrals for additional resources as appropriate  Planned Intervention: Therapist will engage Felicia [name] in behavioral activation, including the incorporation of structure, pleasant events, and mastery activities into her routine  Planned Intervention: Therapist will help Felicia [name] to identify and disengage from maladaptive thought and behavior patterns using CBT-based strategies  Planned Intervention: Therapist will provide Felicia [name] with opportunities to process her experiences in session  Planned Intervention: Therapist will provide Felicia [name] with strategies to regulate her emotions, including self-care, mindfulness, meditation, and relaxation exercises  Treatment Plan Felicia [name] Abilities/Strengths  Felicia [name] presents as motivated to improve her situation.  Felicia [name] Treatment Preferences  None noted.  Felicia [name] Statement of Needs  Felicia [name] is seeking CBT to manage symptoms of depression related to difficulty in social interactions.  Treatment Level  Biweekly  Symptoms  Autism: difficulty perceiving and responding to social cues, engaging in reciprocal conversation (Status: maintained). Depression: sleep irregularity, depressed mood, tearfulness, poor self confidence, feelings of loneliness (Status: maintained).  Problems Addressed  New Description  Goals 1. Felicia [name] experiences symptoms of depression related to  difficulty connecting socially with others Objective Felicia [name] will increase her sense of well-being and connectedness and decrease symptoms of depression Target Date: 2023-05-04 Frequency: Biweekly  Progress: 0 Modality: individual  Related Interventions Therapist will provide referrals for additional resources as appropriate Therapist will work with Pressley to develop her social skills incorporating role-play exercises and video modeling, such as through the [employer] role play videos Therapist will engage Meilin in behavioral activation, including the incorporation of structure, pleasant events, and mastery activities into her routine Therapist will provide Ceclia with strategies to regulate her emotions, including self-care, mindfulness, meditation, and relaxation exercises Therapist will help Promyse to identify and disengage from maladaptive thought patterns using CBT-based strategies Therapist will provide Issa with opportunities to process her experiences in session Diagnosis Axis none 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

## 2023-12-28 DIAGNOSIS — E559 Vitamin D deficiency, unspecified: Secondary | ICD-10-CM | POA: Diagnosis not present

## 2023-12-28 DIAGNOSIS — J4520 Mild intermittent asthma, uncomplicated: Secondary | ICD-10-CM | POA: Diagnosis not present

## 2023-12-28 DIAGNOSIS — Z0000 Encounter for general adult medical examination without abnormal findings: Secondary | ICD-10-CM | POA: Diagnosis not present

## 2023-12-28 DIAGNOSIS — F909 Attention-deficit hyperactivity disorder, unspecified type: Secondary | ICD-10-CM | POA: Diagnosis not present

## 2023-12-28 DIAGNOSIS — J309 Allergic rhinitis, unspecified: Secondary | ICD-10-CM | POA: Diagnosis not present

## 2023-12-28 DIAGNOSIS — Z136 Encounter for screening for cardiovascular disorders: Secondary | ICD-10-CM | POA: Diagnosis not present

## 2023-12-28 DIAGNOSIS — Z0184 Encounter for antibody response examination: Secondary | ICD-10-CM | POA: Diagnosis not present

## 2023-12-30 ENCOUNTER — Ambulatory Visit: Admitting: Clinical

## 2023-12-30 DIAGNOSIS — F331 Major depressive disorder, recurrent, moderate: Secondary | ICD-10-CM

## 2023-12-30 DIAGNOSIS — F840 Autistic disorder: Secondary | ICD-10-CM | POA: Diagnosis not present

## 2023-12-30 NOTE — Progress Notes (Signed)
 Diagnosis: F84.0, F33.1 Time: 11:04 am-11:58 am CPT: 90837P  Joss was seen in person for therapy. She reported doing well overall, but shared frustration related to interactions with one of her supervisors at work. Therapist suggested alternate perspectives, and engaged her in consideration of her options and what might be in her best interest. Therapist also suggested communication strategies. She is scheduled to be seen again in two weeks.  Objectives Related Problem: Kaytlynne experiences symptoms of depression related to difficulty connecting socially with others Description: Delorice will increase her sense of well-being and connectedness and decrease symptoms of depression Target Date: 2024-05-03 Frequency: Biweekly Modality: individual Progress: 0% Planned Intervention: Therapist will provide referrals for additional resources as appropriate  Planned Intervention: Therapist will engage Miyako in behavioral activation, including the incorporation of structure, pleasant events, and mastery activities into her routine  Planned Intervention: Therapist will help Sherisse to identify and disengage from maladaptive thought and behavior patterns using CBT-based strategies  Planned Intervention: Therapist will provide Narcisa with opportunities to process her experiences in session  Planned Intervention: Therapist will provide Sharryn with strategies to regulate her emotions, including self-care, mindfulness, meditation, and relaxation exercises  Treatment Plan Client Abilities/Strengths  Anabelle presents as motivated to improve her situation.  Client Treatment Preferences  None noted.  Client Statement of Needs  Tara is seeking CBT to manage symptoms of depression related to difficulty in social interactions.  Treatment Level  Biweekly  Symptoms  Autism: difficulty perceiving and responding to social cues, engaging in reciprocal conversation (Status: maintained). Depression: sleep irregularity, depressed  mood, tearfulness, poor self confidence, feelings of loneliness (Status: maintained).  Problems Addressed  New Description  Goals 1. Sahvanna experiences symptoms of depression related to difficulty connecting socially with others Objective Markala will increase her sense of well-being and connectedness and decrease symptoms of depression Target Date: 2024-05-03 Frequency: Biweekly  Progress: 0 Modality: individual  Related Interventions Therapist will provide referrals for additional resources as appropriate Therapist will work with Zhana to develop her social skills incorporating role-play exercises and video modeling, such as through the [employer] role play videos Therapist will engage Brissia in behavioral activation, including the incorporation of structure, pleasant events, and mastery activities into her routine Therapist will provide Kyllie with strategies to regulate her emotions, including self-care, mindfulness, meditation, and relaxation exercises Therapist will help Lakera to identify and disengage from maladaptive thought patterns using CBT-based strategies Therapist will provide Lylia with opportunities to process her experiences in session Diagnosis Axis none 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 She was in her home in [place]  and the therapist was in her office at the time of the appointment. Wynetta had recorded an example of becoming frustrated with her friend, and was able to think of an example of a  time when she felt appreciated. Therapist encouraged her to continue noticing when she feels appreciated by others. Session focused on processing events in her last  relationship. She is scheduled to be seen again in two weeks.  Objectives Related Problem: Ticia experiences symptoms of depression related to difficulty connecting socially with others Description: Chelsye will increase her sense of well-being and connectedness and decrease symptoms of depression Target Date: 2023-05-04 Frequency: Biweekly Modality: individual Progress: 0% Planned Intervention: Therapist will provide referrals for additional resources as appropriate  Planned Intervention: Therapist will engage Kobie in behavioral activation, including the incorporation of structure, pleasant events, and mastery activities into her routine  Planned Intervention: Therapist will help Kaytlynne to identify and disengage from maladaptive thought and behavior patterns using CBT-based strategies  Planned Intervention: Therapist will provide Crystalyn with opportunities to process her experiences in session  Planned Intervention: Therapist will provide Ann with strategies to regulate her emotions, including self-care, mindfulness, meditation, and relaxation exercises  Treatment Plan Client Abilities/Strengths  Treina presents as motivated to improve her situation.  Client Treatment Preferences  None noted.  Client Statement of Needs  Tanishia is seeking CBT to manage symptoms of depression related to difficulty in social interactions.  Treatment Level  Biweekly  Symptoms  Autism: difficulty perceiving and responding to social cues, engaging in reciprocal conversation (Status: maintained). Depression: sleep irregularity, depressed mood, tearfulness, poor self confidence, feelings of loneliness (Status: maintained).  Problems Addressed  New Description  Goals 1. Mikaya experiences symptoms of depression related to difficulty connecting socially  with others Objective Anaiya will increase her sense of well-being and connectedness and decrease symptoms of depression Target Date: 2023-05-04 Frequency: Biweekly  Progress: 0 Modality: individual  Related Interventions Therapist will provide referrals for additional resources as appropriate Therapist will work with Arrielle to develop her social skills incorporating role-play exercises and video modeling, such as through the [employer] role play videos Therapist will engage Baneen in behavioral activation, including the incorporation of structure, pleasant events, and mastery activities into her routine Therapist will provide Aiyana with strategies to regulate her emotions, including self-care, mindfulness, meditation, and relaxation exercises Therapist will help Chenell to identify and disengage from maladaptive thought patterns using CBT-based strategies Therapist will provide Janan with opportunities to process her experiences in session Diagnosis Axis none 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

## 2024-01-13 ENCOUNTER — Ambulatory Visit: Admitting: Clinical

## 2024-01-13 DIAGNOSIS — F840 Autistic disorder: Secondary | ICD-10-CM

## 2024-01-13 DIAGNOSIS — F331 Major depressive disorder, recurrent, moderate: Secondary | ICD-10-CM | POA: Diagnosis not present

## 2024-01-13 NOTE — Progress Notes (Signed)
 Diagnosis: F84.0, F33.1 Time: 11:04 am-11:58 am CPT: 90837P  Dolores was seen in person for therapy. Session focused on several interactions during which Nyimah had become frustrated at work, but had managed to regulate her response. Therapist pointed this out, offering validation and support, and pointing out Glendell's success. She shared that she has been receiving quite a bit of very positive feedback at work, and has become fearful that coworkers might be jealous or angry. Therapist offered several alternate ways of looking at this. She is scheduled to be seen again in two weeks.  Objectives Related Problem: Danyell experiences symptoms of depression related to difficulty connecting socially with others Description: Signe will increase her sense of well-being and connectedness and decrease symptoms of depression Target Date: 2024-05-03 Frequency: Biweekly Modality: individual Progress: 0% Planned Intervention: Therapist will provide referrals for additional resources as appropriate  Planned Intervention: Therapist will engage Captola in behavioral activation, including the incorporation of structure, pleasant events, and mastery activities into her routine  Planned Intervention: Therapist will help Aleyda to identify and disengage from maladaptive thought and behavior patterns using CBT-based strategies  Planned Intervention: Therapist will provide Leaann with opportunities to process her experiences in session  Planned Intervention: Therapist will provide Kalasia with strategies to regulate her emotions, including self-care, mindfulness, meditation, and relaxation exercises  Treatment Plan Client Abilities/Strengths  Mckynna presents as motivated to improve her situation.  Client Treatment Preferences  None noted.  Client Statement of Needs  Miguelina is seeking CBT to manage symptoms of depression related to difficulty in social interactions.  Treatment Level  Biweekly  Symptoms  Autism: difficulty  perceiving and responding to social cues, engaging in reciprocal conversation (Status: maintained). Depression: sleep irregularity, depressed mood, tearfulness, poor self confidence, feelings of loneliness (Status: maintained).  Problems Addressed  New Description  Goals 1. Briaunna experiences symptoms of depression related to difficulty connecting socially with others Objective Deashia will increase her sense of well-being and connectedness and decrease symptoms of depression Target Date: 2024-05-03 Frequency: Biweekly  Progress: 0 Modality: individual  Related Interventions Therapist will provide referrals for additional resources as appropriate Therapist will work with Gunhild to develop her social skills incorporating role-play exercises and video modeling, such as through the [employer] role play videos Therapist will engage Solita in behavioral activation, including the incorporation of structure, pleasant events, and mastery activities into her routine Therapist will provide Machaela with strategies to regulate her emotions, including self-care, mindfulness, meditation, and relaxation exercises Therapist will help Mikalah to identify and disengage from maladaptive thought patterns using CBT-based strategies Therapist will provide Laurisa with opportunities to process her experiences in session Diagnosis Axis none 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 She was in her home in [place]  and the therapist was in her office at  the time of the appointment. Zareya had recorded an example of becoming frustrated with her friend, and was able to think of an example of  a time when she felt appreciated. Therapist encouraged her to continue noticing when she feels appreciated by others. Session focused on processing events in her last relationship. She is scheduled to be seen again in two weeks.  Objectives Related Problem: Glayds experiences symptoms of depression related to difficulty connecting socially with others Description: Zoiee will increase her sense of well-being and connectedness and decrease symptoms of depression Target Date: 2023-05-04 Frequency: Biweekly Modality: individual Progress: 0% Planned Intervention: Therapist will provide referrals for additional resources as appropriate  Planned Intervention: Therapist will engage Fiorella in behavioral activation, including the incorporation of structure, pleasant events, and mastery activities into her routine  Planned Intervention: Therapist will help Juliannah to identify and disengage from maladaptive thought and behavior patterns using CBT-based strategies  Planned Intervention: Therapist will provide Angeliz with opportunities to process her experiences in session  Planned Intervention: Therapist will provide Cambrie with strategies to regulate her emotions, including self-care, mindfulness, meditation, and relaxation exercises  Treatment Plan Client Abilities/Strengths  Juliza presents as motivated to improve her situation.  Client Treatment Preferences  None noted.  Client Statement of Needs  Cresta is seeking CBT to manage symptoms of depression related to difficulty in social interactions.  Treatment Level  Biweekly  Symptoms  Autism: difficulty perceiving and responding to social cues, engaging in reciprocal conversation (Status: maintained). Depression: sleep irregularity, depressed mood, tearfulness, poor self confidence, feelings of loneliness (Status:  maintained).  Problems Addressed  New Description  Goals 1. Maysoon experiences symptoms of depression related to difficulty connecting socially with others Objective Shanzay will increase her sense of well-being and connectedness and decrease symptoms of depression Target Date: 2023-05-04 Frequency: Biweekly  Progress: 0 Modality: individual  Related Interventions Therapist will provide referrals for additional resources as appropriate Therapist will work with Shigeko to develop her social skills incorporating role-play exercises and video modeling, such as through the [employer] role play videos Therapist will engage Thanh in behavioral activation, including the incorporation of structure, pleasant events, and mastery activities into her routine Therapist will provide Etter with strategies to regulate her emotions, including self-care, mindfulness, meditation, and relaxation exercises Therapist will help Dan to identify and disengage from maladaptive thought patterns using CBT-based strategies Therapist will provide Chenelle with opportunities to process her experiences in session Diagnosis Axis none 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

## 2024-02-10 ENCOUNTER — Ambulatory Visit: Admitting: Clinical

## 2024-02-10 DIAGNOSIS — F840 Autistic disorder: Secondary | ICD-10-CM

## 2024-02-10 DIAGNOSIS — F331 Major depressive disorder, recurrent, moderate: Secondary | ICD-10-CM

## 2024-02-10 NOTE — Progress Notes (Signed)
 Diagnosis: F84.0, F33.1 Time: 11:01 am-11:58 am CPT: 90837P  Felicia [name] was seen in person for therapy. Session focused on frustrations at work and [employer] fear of the ocean. Therapist suggested communication strategies, explored options, and offered psychoeducation on gradual exposure for anxiety. She is scheduled to be seen again in one month.  Objectives Related Problem: Felicia [name] experiences symptoms of depression related to difficulty connecting socially with others Description: Felicia [name] will increase her sense of well-being and connectedness and decrease symptoms of depression Target Date: 2024-05-03 Frequency: Biweekly Modality: individual Progress: 0% Planned Intervention: Therapist will provide referrals for additional resources as appropriate  Planned Intervention: Therapist will engage Felicia [name] in behavioral activation, including the incorporation of structure, pleasant events, and mastery activities into her routine  Planned Intervention: Therapist will help Felicia [name] to identify and disengage from maladaptive thought and behavior patterns using CBT-based strategies  Planned Intervention: Therapist will provide Felicia [name] with opportunities to process her experiences in session  Planned Intervention: Therapist will provide Felicia [name] with strategies to regulate her emotions, including self-care, mindfulness, meditation, and relaxation exercises  Treatment Plan Client Abilities/Strengths  Felicia [name] presents as motivated to improve her situation.  Client Treatment Preferences  None noted.  Client Statement of Needs  Felicia [name] is seeking CBT to manage symptoms of depression related to difficulty in social interactions.  Treatment Level  Biweekly  Symptoms  Autism: difficulty perceiving and responding to social cues, engaging in reciprocal conversation (Status: maintained). Depression: sleep irregularity, depressed mood, tearfulness, poor self confidence, feelings of loneliness (Status: maintained).  Problems  Addressed  New Description  Goals 1. Felicia [name] experiences symptoms of depression related to difficulty connecting socially with others Objective Felicia [name] will increase her sense of well-being and connectedness and decrease symptoms of depression Target Date: 2024-05-03 Frequency: Biweekly  Progress: 0 Modality: individual  Related Interventions Therapist will provide referrals for additional resources as appropriate Therapist will work with Felicia [name] to develop her social skills incorporating role-play exercises and video modeling, such as through the [employer] role play videos Therapist will engage Felicia [name] in behavioral activation, including the incorporation of structure, pleasant events, and mastery activities into her routine Therapist will provide Felicia [name] with strategies to regulate her emotions, including self-care, mindfulness, meditation, and relaxation exercises Therapist will help Felicia [name] to identify and disengage from maladaptive thought patterns using CBT-based strategies Therapist will provide Felicia [name] with opportunities to process her experiences in session Diagnosis Axis none 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 She was in her home in [place]  and the therapist was in her office at the time of the appointment. Felicia [name] had recorded an example of becoming frustrated with her friend, and was able to think of an example of a time when she felt appreciated. Therapist encouraged her to continue noticing when she feels  appreciated by others. Session focused on processing events in her last relationship. She is scheduled to be seen again in two weeks.  Objectives Related Problem: Felicia [name] experiences  symptoms of depression related to difficulty connecting socially with others Description: Felicia [name] will increase her sense of well-being and connectedness and decrease symptoms of depression Target Date: 2023-05-04 Frequency: Biweekly Modality: individual Progress: 0% Planned Intervention: Therapist will provide referrals for additional resources as appropriate  Planned Intervention: Therapist will engage Felicia [name] in behavioral activation, including the incorporation of structure, pleasant events, and mastery activities into her routine  Planned Intervention: Therapist will help Felicia [name] to identify and disengage from maladaptive thought and behavior patterns using CBT-based strategies  Planned Intervention: Therapist will provide Felicia [name] with opportunities to process her experiences in session  Planned Intervention: Therapist will provide Felicia [name] with strategies to regulate her emotions, including self-care, mindfulness, meditation, and relaxation exercises  Treatment Plan Client Abilities/Strengths  Felicia [name] presents as motivated to improve her situation.  Client Treatment Preferences  None noted.  Client Statement of Needs  Felicia [name] is seeking CBT to manage symptoms of depression related to difficulty in social interactions.  Treatment Level  Biweekly  Symptoms  Autism: difficulty perceiving and responding to social cues, engaging in reciprocal conversation (Status: maintained). Depression: sleep irregularity, depressed mood, tearfulness, poor self confidence, feelings of loneliness (Status: maintained).  Problems Addressed  New Description  Goals 1. Felicia [name] experiences symptoms of depression related to difficulty connecting socially with others Objective Felicia [name] will increase her sense of well-being and connectedness and  decrease symptoms of depression Target Date: 2023-05-04 Frequency: Biweekly  Progress: 0 Modality: individual  Related Interventions Therapist will provide referrals for additional resources as appropriate Therapist will work with Maylee to develop her social skills incorporating role-play exercises and video modeling, such as through the [employer] role play videos Therapist will engage Rexanna in behavioral activation, including the incorporation of structure, pleasant events, and mastery activities into her routine Therapist will provide Antonisha with strategies to regulate her emotions, including self-care, mindfulness, meditation, and relaxation exercises Therapist will help Caitlin to identify and disengage from maladaptive thought patterns using CBT-based strategies Therapist will provide Mykalah with opportunities to process her experiences in session Diagnosis Axis none 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

## 2024-02-24 ENCOUNTER — Ambulatory Visit: Admitting: Clinical

## 2024-02-29 DIAGNOSIS — F3489 Other specified persistent mood disorders: Secondary | ICD-10-CM | POA: Diagnosis not present

## 2024-02-29 DIAGNOSIS — F419 Anxiety disorder, unspecified: Secondary | ICD-10-CM | POA: Diagnosis not present

## 2024-02-29 DIAGNOSIS — F900 Attention-deficit hyperactivity disorder, predominantly inattentive type: Secondary | ICD-10-CM | POA: Diagnosis not present

## 2024-03-02 DIAGNOSIS — Z0000 Encounter for general adult medical examination without abnormal findings: Secondary | ICD-10-CM | POA: Diagnosis not present

## 2024-03-09 ENCOUNTER — Ambulatory Visit: Admitting: Clinical

## 2024-03-09 DIAGNOSIS — F840 Autistic disorder: Secondary | ICD-10-CM | POA: Diagnosis not present

## 2024-03-09 DIAGNOSIS — F331 Major depressive disorder, recurrent, moderate: Secondary | ICD-10-CM

## 2024-03-09 NOTE — Progress Notes (Signed)
 Diagnosis: F84.0, F33.1 Time: 11:01 am-11:58 am CPT: 90837P  Felicia [name] was seen in person for therapy. Session focused on continued frustrations at work and irritation with friends, which Felicia [name] reported had increased since she had started taking [place]. Therapist engaged her in consideration of alternate perspectives, and when, whether, and how to communicate her feelings. She is scheduled to be seen again in two weeks.   Objectives Related Problem: Felicia [name] experiences symptoms of depression related to difficulty connecting socially with others Description: Felicia [name] will increase her sense of well-being and connectedness and decrease symptoms of depression Target Date: 2024-05-03 Frequency: Biweekly Modality: individual Progress: 0% Planned Intervention: Therapist will provide referrals for additional resources as appropriate  Planned Intervention: Therapist will engage Felicia [name] in behavioral activation, including the incorporation of structure, pleasant events, and mastery activities into her routine  Planned Intervention: Therapist will help Felicia [name] to identify and disengage from maladaptive thought and behavior patterns using CBT-based strategies  Planned Intervention: Therapist will provide Felicia [name] with opportunities to process her experiences in session  Planned Intervention: Therapist will provide Felicia [name] with strategies to regulate her emotions, including self-care, mindfulness, meditation, and relaxation exercises  Treatment Plan Client Abilities/Strengths  Felicia [name] presents as motivated to improve her situation.  Client Treatment Preferences  None noted.  Client Statement of Needs  Felicia [name] is seeking CBT to manage symptoms of depression related to difficulty in social interactions.  Treatment Level  Biweekly  Symptoms  Autism: difficulty perceiving and responding to social cues, engaging in reciprocal conversation (Status: maintained). Depression: sleep irregularity, depressed mood, tearfulness, poor  self confidence, feelings of loneliness (Status: maintained).  Problems Addressed  New Description  Goals 1. Felicia [name] experiences symptoms of depression related to difficulty connecting socially with others Objective Felicia [name] will increase her sense of well-being and connectedness and decrease symptoms of depression Target Date: 2024-05-03 Frequency: Biweekly  Progress: 0 Modality: individual  Related Interventions Therapist will provide referrals for additional resources as appropriate Therapist will work with Felicia [name] to develop her social skills incorporating role-play exercises and video modeling, such as through the [employer] role play videos Therapist will engage Felicia [name] in behavioral activation, including the incorporation of structure, pleasant events, and mastery activities into her routine Therapist will provide Zaydah with strategies to regulate her emotions, including self-care, mindfulness, meditation, and relaxation exercises Therapist will help Felicia [name] to identify and disengage from maladaptive thought patterns using CBT-based strategies Therapist will provide Felicia [name] with opportunities to process her experiences in session Diagnosis Axis none 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 She was in her home in [place]  and the therapist was in her office at the time of the appointment. Felicia [name] had recorded an example of becoming frustrated with her friend, and was able to think of an example of a time when she felt  appreciated. Therapist encouraged her to continue noticing when she feels appreciated by others. Session focused on processing events in her last relationship. She is  scheduled to be seen again in two weeks.  Objectives Related Problem: Felicia [name] experiences symptoms of depression related to difficulty connecting socially with others Description: Felicia [name] will increase her sense of well-being and connectedness and decrease symptoms of depression Target Date: 2023-05-04 Frequency: Biweekly Modality: individual Progress: 0% Planned Intervention: Therapist will provide referrals for additional resources as appropriate  Planned Intervention: Therapist will engage Felicia [name] in behavioral activation, including the incorporation of structure, pleasant events, and mastery activities into her routine  Planned Intervention: Therapist will help Felicia [name] to identify and disengage from maladaptive thought and behavior patterns using CBT-based strategies  Planned Intervention: Therapist will provide Felicia [name] with opportunities to process her experiences in session  Planned Intervention: Therapist will provide Felicia [name] with strategies to regulate her emotions, including self-care, mindfulness, meditation, and relaxation exercises  Treatment Plan Client Abilities/Strengths  Felicia [name] presents as motivated to improve her situation.  Client Treatment Preferences  None noted.  Client Statement of Needs  Felicia [name] is seeking CBT to manage symptoms of depression related to difficulty in social interactions.  Treatment Level  Biweekly  Symptoms  Autism: difficulty perceiving and responding to social cues, engaging in reciprocal conversation (Status: maintained). Depression: sleep irregularity, depressed mood, tearfulness, poor self confidence, feelings of loneliness (Status: maintained).  Problems Addressed  New Description  Goals 1. Felicia [name] experiences symptoms of depression related to difficulty connecting socially with  others Objective Felicia [name] will increase her sense of well-being and connectedness and decrease symptoms of depression Target Date: 2023-05-04 Frequency: Biweekly  Progress: 0 Modality: individual  Related Interventions Therapist will provide referrals for additional resources as appropriate Therapist will work with Eldine to develop her social skills incorporating role-play exercises and video modeling, such as through the [employer] role play videos Therapist will engage Abraham in behavioral activation, including the incorporation of structure, pleasant events, and mastery activities into her routine Therapist will provide Coralee with strategies to regulate her emotions, including self-care, mindfulness, meditation, and relaxation exercises Therapist will help [name] to identify and disengage from maladaptive thought patterns using CBT-based strategies Therapist will provide Cheria with opportunities to process her experiences in session Diagnosis Axis none 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

## 2024-03-23 ENCOUNTER — Ambulatory Visit: Admitting: Clinical

## 2024-03-23 DIAGNOSIS — F840 Autistic disorder: Secondary | ICD-10-CM

## 2024-03-23 DIAGNOSIS — F331 Major depressive disorder, recurrent, moderate: Secondary | ICD-10-CM | POA: Diagnosis not present

## 2024-03-23 NOTE — Progress Notes (Signed)
 Diagnosis: F84.0, F33.1 Time: 11:01 am-11:58 am CPT: 90837P  Felicia [name] was seen in person for therapy. Session focused on frustrations at work, which continue to escalate. Therapist engaged her in consideration of benefits and drawbacks of her anger, and the idea that staying calm during interactions at work will work toward her benefit. Therapist suggested a strategy of taking a deep breath and stating my calm is my strength before going into interactions, as well as stepping away if she feels she is going to act out. She is scheduled to be seen again in two weeks.   Objectives Related Problem: Felicia [name] experiences symptoms of depression related to difficulty connecting socially with others Description: Felicia [name] will increase her sense of well-being and connectedness and decrease symptoms of depression Target Date: 2024-05-03 Frequency: Biweekly Modality: individual Progress: 0% Planned Intervention: Therapist will provide referrals for additional resources as appropriate  Planned Intervention: Therapist will engage Felicia [name] in behavioral activation, including the incorporation of structure, pleasant events, and mastery activities into her routine  Planned Intervention: Therapist will help Felicia [name] to identify and disengage from maladaptive thought and behavior patterns using CBT-based strategies  Planned Intervention: Therapist will provide Sameera with opportunities to process her experiences in session  Planned Intervention: Therapist will provide Lynanne with strategies to regulate her emotions, including self-care, mindfulness, meditation, and relaxation exercises  Treatment Plan Client Abilities/Strengths  Felicia [name] presents as motivated to improve her situation.  Client Treatment Preferences  None noted.  Client Statement of Needs  [name] is seeking CBT to manage symptoms of depression related to difficulty in social interactions.  Treatment Level  Biweekly  Symptoms  Autism: difficulty perceiving and  responding to social cues, engaging in reciprocal conversation (Status: maintained). Depression: sleep irregularity, depressed mood, tearfulness, poor self confidence, feelings of loneliness (Status: maintained).  Problems Addressed  New Description  Goals 1. [name] experiences symptoms of depression related to difficulty connecting socially with others Objective Felicia [name] will increase her sense of well-being and connectedness and decrease symptoms of depression Target Date: 2024-05-03 Frequency: Biweekly  Progress: 0 Modality: individual  Related Interventions Therapist will provide referrals for additional resources as appropriate Therapist will work with Felicia [name] to develop her social skills incorporating role-play exercises and video modeling, such as through the [employer] role play videos Therapist will engage Felicia [name] in behavioral activation, including the incorporation of structure, pleasant events, and mastery activities into her routine Therapist will provide [name] with strategies to regulate her emotions, including self-care, mindfulness, meditation, and relaxation exercises Therapist will help Felicia [name] to identify and disengage from maladaptive thought patterns using CBT-based strategies Therapist will provide Felicia [name] with opportunities to process her experiences in session Diagnosis Axis none 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 She was in her home in [place]  and the therapist was in her office at the time of  the appointment. Felicia [name] had recorded an example of becoming frustrated with her friend, and was able to think of  an example of a time when she felt appreciated. Therapist encouraged her to continue noticing when she feels appreciated by others. Session focused on processing events in her last relationship. She is scheduled to be seen again in two weeks.  Objectives Related Problem: Felicia [name] experiences symptoms of depression related to difficulty connecting socially with others Description: Felicia [name] will increase her sense of well-being and connectedness and decrease symptoms of depression Target Date: 2023-05-04 Frequency: Biweekly Modality: individual Progress: 0% Planned Intervention: Therapist will provide referrals for additional resources as appropriate  Planned Intervention: Therapist will engage Felicia [name] in behavioral activation, including the incorporation of structure, pleasant events, and mastery activities into her routine  Planned Intervention: Therapist will help Felicia [name] to identify and disengage from maladaptive thought and behavior patterns using CBT-based strategies  Planned Intervention: Therapist will provide Felicia [name] with opportunities to process her experiences in session  Planned Intervention: Therapist will provide Felicia [name] with strategies to regulate her emotions, including self-care, mindfulness, meditation, and relaxation exercises  Treatment Plan Client Abilities/Strengths  Felicia [name] presents as motivated to improve her situation.  Client Treatment Preferences  None noted.  Client Statement of Needs  Felicia [name] is seeking CBT to manage symptoms of depression related to difficulty in social interactions.  Treatment Level  Biweekly  Symptoms  Autism: difficulty perceiving and responding to social cues, engaging in reciprocal conversation (Status: maintained). Depression: sleep irregularity, depressed mood, tearfulness, poor self confidence, feelings of loneliness (Status: maintained).   Problems Addressed  New Description  Goals 1. Felicia [name] experiences symptoms of depression related to difficulty connecting socially with others Objective [name] will increase her sense of well-being and connectedness and decrease symptoms of depression Target Date: 2023-05-04 Frequency: Biweekly  Progress: 0 Modality: individual  Related Interventions Therapist will provide referrals for additional resources as appropriate Therapist will work with Jozlyn to develop her social skills incorporating role-play exercises and video modeling, such as through the [employer] role play videos Therapist will engage Caysie in behavioral activation, including the incorporation of structure, pleasant events, and mastery activities into her routine Therapist will provide Sheilah with strategies to regulate her emotions, including self-care, mindfulness, meditation, and relaxation exercises Therapist will help Baudelia to identify and disengage from maladaptive thought patterns using CBT-based strategies Therapist will provide Dalaney with opportunities to process her experiences in session Diagnosis Axis none 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

## 2024-03-24 DIAGNOSIS — H5203 Hypermetropia, bilateral: Secondary | ICD-10-CM | POA: Diagnosis not present

## 2024-03-29 DIAGNOSIS — F900 Attention-deficit hyperactivity disorder, predominantly inattentive type: Secondary | ICD-10-CM | POA: Diagnosis not present

## 2024-03-29 DIAGNOSIS — F419 Anxiety disorder, unspecified: Secondary | ICD-10-CM | POA: Diagnosis not present

## 2024-03-29 DIAGNOSIS — F3489 Other specified persistent mood disorders: Secondary | ICD-10-CM | POA: Diagnosis not present

## 2024-04-06 ENCOUNTER — Ambulatory Visit: Admitting: Clinical

## 2024-04-06 DIAGNOSIS — F840 Autistic disorder: Secondary | ICD-10-CM | POA: Diagnosis not present

## 2024-04-06 DIAGNOSIS — F331 Major depressive disorder, recurrent, moderate: Secondary | ICD-10-CM

## 2024-04-06 NOTE — Progress Notes (Signed)
 Diagnosis: F84.0, F33.1 Time: 11:01 am-11:58 am CPT: 90837P  Felicia [name] was seen in person for therapy. She reported increased irritability and difficulty with impulsive negative remarks toward others. Therapist engaged her in challenging negative interpretations of others' intent. For homework, she will try pausing for an hour before making negative comments toward others, in order to weight the possible consequences and determine if she still wants to. She is scheduled to be seen again in two weeks.   Objectives Related Problem: Felicia [name] experiences symptoms of depression related to difficulty connecting socially with others Description: Felicia [name] will increase her sense of well-being and connectedness and decrease symptoms of depression Target Date: 2024-05-03 Frequency: Biweekly Modality: individual Progress: 0% Planned Intervention: Therapist will provide referrals for additional resources as appropriate  Planned Intervention: Therapist will engage Felicia [name] in behavioral activation, including the incorporation of structure, pleasant events, and mastery activities into her routine  Planned Intervention: Therapist will help Felicia [name] to identify and disengage from maladaptive thought and behavior patterns using CBT-based strategies  Planned Intervention: Therapist will provide Felicia [name] with opportunities to process her experiences in session  Planned Intervention: Therapist will provide Felicia [name] with strategies to regulate her emotions, including self-care, mindfulness, meditation, and relaxation exercises  Treatment Plan Client Abilities/Strengths  Felicia [name] presents as motivated to improve her situation.  Client Treatment Preferences  None noted.  Client Statement of Needs  Felicia [name] is seeking CBT to manage symptoms of depression related to difficulty in social interactions.  Treatment Level  Biweekly  Symptoms  Autism: difficulty perceiving and responding to social cues, engaging in reciprocal conversation  (Status: maintained). Depression: sleep irregularity, depressed mood, tearfulness, poor self confidence, feelings of loneliness (Status: maintained).  Problems Addressed  New Description  Goals 1. Felicia [name] experiences symptoms of depression related to difficulty connecting socially with others Objective Felicia [name] will increase her sense of well-being and connectedness and decrease symptoms of depression Target Date: 2024-05-03 Frequency: Biweekly  Progress: 0 Modality: individual  Related Interventions Therapist will provide referrals for additional resources as appropriate Therapist will work with Felicia [name] to develop her social skills incorporating role-play exercises and video modeling, such as through the [employer] role play videos Therapist will engage Felicia [name] in behavioral activation, including the incorporation of structure, pleasant events, and mastery activities into her routine Therapist will provide Felicia [name] with strategies to regulate her emotions, including self-care, mindfulness, meditation, and relaxation exercises Therapist will help Felicia [name] to identify and disengage from maladaptive thought patterns using CBT-based strategies Therapist will provide Felicia [name] with opportunities to process her experiences in session Diagnosis Axis none 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 She was in her home in [place]  and the therapist was in her office at the time of the appointment. Felicia [name] had recorded an example of becoming  frustrated with her friend, and was able to think of an example of a time when she felt appreciated. Therapist encouraged her to continue noticing when she feels appreciated by  others. Session focused on processing events in her last relationship. She is scheduled to be seen again in two weeks.  Objectives Related Problem: Felicia [name] experiences symptoms of depression related to difficulty connecting socially with others Description: Felicia [name] will increase her sense of well-being and connectedness and decrease symptoms of depression Target Date: 2023-05-04 Frequency: Biweekly Modality: individual Progress: 0% Planned Intervention: Therapist will provide referrals for additional resources as appropriate  Planned Intervention: Therapist will engage Felicia [name] in behavioral activation, including the incorporation of structure, pleasant events, and mastery activities into her routine  Planned Intervention: Therapist will help Felicia [name] to identify and disengage from maladaptive thought and behavior patterns using CBT-based strategies  Planned Intervention: Therapist will provide Felicia [name] with opportunities to process her experiences in session  Planned Intervention: Therapist will provide Felicia [name] with strategies to regulate her emotions, including self-care, mindfulness, meditation, and relaxation exercises  Treatment Plan Client Abilities/Strengths  Felicia [name] presents as motivated to improve her situation.  Client Treatment Preferences  None noted.  Client Statement of Needs  Felicia [name] is seeking CBT to manage symptoms of depression related to difficulty in social interactions.  Treatment Level  Biweekly  Symptoms  Autism: difficulty perceiving and responding to social cues, engaging in reciprocal conversation (Status: maintained). Depression: sleep irregularity, depressed mood, tearfulness, poor self confidence, feelings of loneliness (Status: maintained).  Problems Addressed  New Description  Goals 1. Felicia [name] experiences  symptoms of depression related to difficulty connecting socially with others Objective Felicia [name] will increase her sense of well-being and connectedness and decrease symptoms of depression Target Date: 2023-05-04 Frequency: Biweekly  Progress: 0 Modality: individual  Related Interventions Therapist will provide referrals for additional resources as appropriate Therapist will work with Felicia [name] to develop her social skills incorporating role-play exercises and video modeling, such as through the [employer] role play videos Therapist will engage Amaal in behavioral activation, including the incorporation of structure, pleasant events, and mastery activities into her routine Therapist will provide Pennelope with strategies to regulate her emotions, including self-care, mindfulness, meditation, and relaxation exercises Therapist will help Durga to identify and disengage from maladaptive thought patterns using CBT-based strategies Therapist will provide Onnie with opportunities to process her experiences in session Diagnosis Axis none 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

## 2024-04-20 ENCOUNTER — Ambulatory Visit: Admitting: Clinical

## 2024-04-20 DIAGNOSIS — F840 Autistic disorder: Secondary | ICD-10-CM

## 2024-04-20 DIAGNOSIS — F331 Major depressive disorder, recurrent, moderate: Secondary | ICD-10-CM | POA: Diagnosis not present

## 2024-04-20 NOTE — Progress Notes (Signed)
 : F84.0, F33.1 Time: 11:01 am-11:58 am CPT: 90837P  Felicia [name] was seen in person for therapy. She reported a significant reduction in irritability at work, and attributed this to the writer. Session focused on developments in Felicia [name]'s relationships, with the therapist offering alternative perspectives and communication strategies. She is scheduled to be seen again in two weeks.   Objectives Related Problem: Felicia [name] experiences symptoms of depression related to difficulty connecting socially with others Description: Felicia [name] will increase her sense of well-being and connectedness and decrease symptoms of depression Target Date: 2024-05-03 Frequency: Biweekly Modality: individual Progress: 0% Planned Intervention: Therapist will provide referrals for additional resources as appropriate  Planned Intervention: Therapist will engage Felicia [name] in behavioral activation, including the incorporation of structure, pleasant events, and mastery activities into her routine  Planned Intervention: Therapist will help Felicia [name] to identify and disengage from maladaptive thought and behavior patterns using CBT-based strategies  Planned Intervention: Therapist will provide Felicia [name] with opportunities to process her experiences in session  Planned Intervention: Therapist will provide Felicia [name] with strategies to regulate her emotions, including self-care, mindfulness, meditation, and relaxation exercises  Treatment Plan Client Abilities/Strengths  Felicia [name] presents as motivated to improve her situation.  Client Treatment Preferences  None noted.  Client Statement of Needs  Felicia [name] is seeking CBT to manage symptoms of depression related to difficulty in social interactions.  Treatment Level  Biweekly  Symptoms  Autism: difficulty perceiving and responding to social cues, engaging in reciprocal conversation (Status: maintained). Depression: sleep irregularity, depressed mood, tearfulness, poor self confidence, feelings of loneliness  (Status: maintained).  Problems Addressed  New Description  Goals 1. Felicia [name] experiences symptoms of depression related to difficulty connecting socially with others Objective Felicia [name] will increase her sense of well-being and connectedness and decrease symptoms of depression Target Date: 2024-05-03 Frequency: Biweekly  Progress: 0 Modality: individual  Related Interventions Therapist will provide referrals for additional resources as appropriate Therapist will work with Felicia [name] to develop her social skills incorporating role-play exercises and video modeling, such as through the [employer] role play videos Therapist will engage Felicia [name] in behavioral activation, including the incorporation of structure, pleasant events, and mastery activities into her routine Therapist will provide Felicia [name] with strategies to regulate her emotions, including self-care, mindfulness, meditation, and relaxation exercises Therapist will help Felicia [name] to identify and disengage from maladaptive thought patterns using CBT-based strategies Therapist will provide Felicia [name] with opportunities to process her experiences in session Diagnosis Axis none 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 She was in her home in [place]  and the therapist was in her office at the time of the appointment. Felicia [name] had recorded an example of becoming frustrated with her friend, and was able to think of an example of a time when she felt appreciated. Therapist encouraged her to  continue noticing when she feels appreciated by others. Session focused on processing events in her last relationship. She is scheduled to be seen again in two  weeks.  Objectives Related Problem: Felicia [name] experiences symptoms of depression related to difficulty connecting socially with others Description: Felicia [name] will increase her sense of well-being and connectedness and decrease symptoms of depression Target Date: 2023-05-04 Frequency: Biweekly Modality: individual Progress: 0% Planned Intervention: Therapist will provide referrals for additional resources as appropriate  Planned Intervention: Therapist will engage Felicia [name] in behavioral activation, including the incorporation of structure, pleasant events, and mastery activities into her routine  Planned Intervention: Therapist will help Felicia [name] to identify and disengage from maladaptive thought and behavior patterns using CBT-based strategies  Planned Intervention: Therapist will provide Felicia [name] with opportunities to process her experiences in session  Planned Intervention: Therapist will provide Felicia [name] with strategies to regulate her emotions, including self-care, mindfulness, meditation, and relaxation exercises  Treatment Plan Client Abilities/Strengths  Felicia [name] presents as motivated to improve her situation.  Client Treatment Preferences  None noted.  Client Statement of Needs  Felicia [name] is seeking CBT to manage symptoms of depression related to difficulty in social interactions.  Treatment Level  Biweekly  Symptoms  Autism: difficulty perceiving and responding to social cues, engaging in reciprocal conversation (Status: maintained). Depression: sleep irregularity, depressed mood, tearfulness, poor self confidence, feelings of loneliness (Status: maintained).  Problems Addressed  New Description  Goals 1. Felicia [name] experiences symptoms of depression related to difficulty connecting socially with others Objective Felicia [name] will increase her sense of  well-being and connectedness and decrease symptoms of depression Target Date: 2023-05-04 Frequency: Biweekly  Progress: 0 Modality: individual  Related Interventions Therapist will provide referrals for additional resources as appropriate Therapist will work with Felma to develop her social skills incorporating role-play exercises and video modeling, such as through the [employer] role play videos Therapist will engage Murry in behavioral activation, including the incorporation of structure, pleasant events, and mastery activities into her routine Therapist will provide Angelia with strategies to regulate her emotions, including self-care, mindfulness, meditation, and relaxation exercises Therapist will help Desmond to identify and disengage from maladaptive thought patterns using CBT-based strategies Therapist will provide Alaijah with opportunities to process her experiences in session Diagnosis Axis none 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

## 2024-04-20 NOTE — Progress Notes (Signed)
 Diagnosis: F84.0, F33.1 Time: 11:01 am-11:58 am CPT: 90837P  Felicia [name] was seen in person for therapy. She reported a significant improvement in irritability and mood, which she attributed to the new manager at work. Session focused on developments in her relationships. Therapist explore alternate perspectives with her and suggested communication strategies. She is scheduled to be seen again in two weeks.   Objectives Related Problem: Felicia [name] experiences symptoms of depression related to difficulty connecting socially with others Description: Felicia [name] will increase her sense of well-being and connectedness and decrease symptoms of depression Target Date: 2024-05-03 Frequency: Biweekly Modality: individual Progress: 0% Planned Intervention: Therapist will provide referrals for additional resources as appropriate  Planned Intervention: Therapist will engage Felicia [name] in behavioral activation, including the incorporation of structure, pleasant events, and mastery activities into her routine  Planned Intervention: Therapist will help Felicia [name] to identify and disengage from maladaptive thought and behavior patterns using CBT-based strategies  Planned Intervention: Therapist will provide Felicia [name] with opportunities to process her experiences in session  Planned Intervention: Therapist will provide Felicia [name] with strategies to regulate her emotions, including self-care, mindfulness, meditation, and relaxation exercises  Treatment Plan Client Abilities/Strengths  Felicia [name] presents as motivated to improve her situation.  Client Treatment Preferences  None noted.  Client Statement of Needs  Felicia [name] is seeking CBT to manage symptoms of depression related to difficulty in social interactions.  Treatment Level  Biweekly  Symptoms  Autism: difficulty perceiving and responding to social cues, engaging in reciprocal conversation (Status: maintained). Depression: sleep irregularity, depressed mood, tearfulness, poor self confidence,  feelings of loneliness (Status: maintained).  Problems Addressed  New Description  Goals 1. Felicia [name] experiences symptoms of depression related to difficulty connecting socially with others Objective Felicia [name] will increase her sense of well-being and connectedness and decrease symptoms of depression Target Date: 2024-05-03 Frequency: Biweekly  Progress: 0 Modality: individual  Related Interventions Therapist will provide referrals for additional resources as appropriate Therapist will work with Felicia [name] to develop her social skills incorporating role-play exercises and video modeling, such as through the [employer] role play videos Therapist will engage Felicia [name] in behavioral activation, including the incorporation of structure, pleasant events, and mastery activities into her routine Therapist will provide Felicia [name] with strategies to regulate her emotions, including self-care, mindfulness, meditation, and relaxation exercises Therapist will help Felicia [name] to identify and disengage from maladaptive thought patterns using CBT-based strategies Therapist will provide Felicia [name] with opportunities to process her experiences in session Diagnosis Axis none 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 She was in her home in [place]  and the therapist was in her office at the time of the appointment. Felicia [name] had recorded an example of becoming frustrated with her friend, and was able to think of an example of a time when she felt appreciated. Therapist  encouraged her to continue noticing when she feels appreciated by others. Session focused on processing events in her last relationship. She is scheduled to be seen  again in two weeks.  Objectives Related Problem: Felicia [name] experiences symptoms of depression related to difficulty connecting socially with others Description: Felicia [name] will increase her sense of well-being and connectedness and decrease symptoms of depression Target Date: 2023-05-04 Frequency: Biweekly Modality: individual Progress: 0% Planned Intervention: Therapist will provide referrals for additional resources as appropriate  Planned Intervention: Therapist will engage Felicia [name] in behavioral activation, including the incorporation of structure, pleasant events, and mastery activities into her routine  Planned Intervention: Therapist will help Felicia [name] to identify and disengage from maladaptive thought and behavior patterns using CBT-based strategies  Planned Intervention: Therapist will provide Felicia [name] with opportunities to process her experiences in session  Planned Intervention: Therapist will provide Felicia [name] with strategies to regulate her emotions, including self-care, mindfulness, meditation, and relaxation exercises  Treatment Plan Client Abilities/Strengths  Felicia [name] presents as motivated to improve her situation.  Client Treatment Preferences  None noted.  Client Statement of Needs  Felicia [name] is seeking CBT to manage symptoms of depression related to difficulty in social interactions.  Treatment Level  Biweekly  Symptoms  Autism: difficulty perceiving and responding to social cues, engaging in reciprocal conversation (Status: maintained). Depression: sleep irregularity, depressed mood, tearfulness, poor self confidence, feelings of loneliness (Status: maintained).  Problems Addressed  New Description  Goals 1. Felicia [name] experiences symptoms of depression related to difficulty connecting socially with others Objective Felicia [name] will increase  her sense of well-being and connectedness and decrease symptoms of depression Target Date: 2023-05-04 Frequency: Biweekly  Progress: 0 Modality: individual  Related Interventions Therapist will provide referrals for additional resources as appropriate Therapist will work with Felicia [name] to develop her social skills incorporating role-play exercises and video modeling, such as through the [employer] role play videos Therapist will engage Felicia [name] in behavioral activation, including the incorporation of structure, pleasant events, and mastery activities into her routine Therapist will provide Felicia [name] with strategies to regulate her emotions, including self-care, mindfulness, meditation, and relaxation exercises Therapist will help Felicia [name] to identify and disengage from maladaptive thought patterns using CBT-based strategies Therapist will provide Braedyn with opportunities to process her experiences in session Diagnosis Axis none 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

## 2024-05-04 ENCOUNTER — Ambulatory Visit: Admitting: Clinical

## 2024-05-04 DIAGNOSIS — F331 Major depressive disorder, recurrent, moderate: Secondary | ICD-10-CM | POA: Diagnosis not present

## 2024-05-04 DIAGNOSIS — F840 Autistic disorder: Secondary | ICD-10-CM | POA: Diagnosis not present

## 2024-05-04 NOTE — Progress Notes (Addendum)
 Diagnosis: F84.0, F33.1 Time: 11:01 am-11:58 am CPT: 90837P  Meranda was seen in person for therapy. She reported that she has been doing well overall. Session focused on communication strategies she can use to broach a situation at work. She is scheduled to be seen again in two weeks.   Objectives Related Problem: Jatziri experiences symptoms of depression related to difficulty connecting socially with others Description: Dollie will increase her sense of well-being and connectedness and decrease symptoms of depression Target Date: 2025-05-03 Frequency: Biweekly Modality: individual Progress: 0% Planned Intervention: Therapist will provide referrals for additional resources as appropriate  Planned Intervention: Therapist will engage Vincenta in behavioral activation, including the incorporation of structure, pleasant events, and mastery activities into her routine  Planned Intervention: Therapist will help Timea to identify and disengage from maladaptive thought and behavior patterns using CBT-based strategies  Planned Intervention: Therapist will provide Katharyn with opportunities to process her experiences in session  Planned Intervention: Therapist will provide Elaina with strategies to regulate her emotions, including self-care, mindfulness, meditation, and relaxation exercises  Treatment Plan Client Abilities/Strengths  Chasitee presents as motivated to improve her situation.  Client Treatment Preferences  None noted.  Client Statement of Needs  Correne is seeking CBT to manage symptoms of depression related to difficulty in social interactions.  Treatment Level  Biweekly  Symptoms  Autism: difficulty perceiving and responding to social cues, engaging in reciprocal conversation (Status: maintained). Depression: sleep irregularity, depressed mood, tearfulness, poor self confidence, feelings of loneliness (Status: maintained).  Problems Addressed  New Description  Goals 1. Gerre experiences  symptoms of depression related to difficulty connecting socially with others Objective Michon will increase her sense of well-being and connectedness and decrease symptoms of depression Target Date: 2025-05-03 Frequency: Biweekly  Progress: 0 Modality: individual  Related Interventions Therapist will provide referrals for additional resources as appropriate Therapist will work with Zoraida to develop her social skills incorporating role-play exercises and video modeling, such as through the [employer] role play videos Therapist will engage Serah in behavioral activation, including the incorporation of structure, pleasant events, and mastery activities into her routine Therapist will provide Narya with strategies to regulate her emotions, including self-care, mindfulness, meditation, and relaxation exercises Therapist will help Carlo to identify and disengage from maladaptive thought patterns using CBT-based strategies Therapist will provide Freyja with opportunities to process her experiences in session Diagnosis Axis none 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

## 2024-05-18 ENCOUNTER — Ambulatory Visit: Admitting: Clinical

## 2024-05-18 DIAGNOSIS — F840 Autistic disorder: Secondary | ICD-10-CM

## 2024-05-18 DIAGNOSIS — F331 Major depressive disorder, recurrent, moderate: Secondary | ICD-10-CM

## 2024-05-18 NOTE — Progress Notes (Signed)
 Diagnosis: F84.0, F33.1 Time: 11:01 am-11:58 am CPT: 90791P  Maylani was seen in person for therapy. She reported that she has been doing well overall. Session began by reviewing and updating her treatment plan. Lahoma had input into and verbally consented to all goals and interventions. She reflected upon challenges at work, and therapist suggested communication strategies. She is scheduled to be seen again in two weeks.  Objectives Related Problem: [name] experiences symptoms of depression related to difficulty connecting socially with others Description: Karlee will increase her sense of well-being and connectedness and decrease symptoms of depression Target Date: 2025-05-18 Frequency: Biweekly Modality: individual Progress: 30% Planned Intervention: Therapist will provide referrals for additional resources as appropriate  Planned Intervention: Therapist will engage Richell in behavioral activation, including the incorporation of structure, pleasant events, and mastery activities into her routine  Planned Intervention: Therapist will help Kindred to identify and disengage from maladaptive thought and behavior patterns using CBT-based strategies  Planned Intervention: Therapist will provide Anjannette with opportunities to process her experiences in session  Planned Intervention: Therapist will provide Eudora with strategies to regulate her emotions, including self-care, mindfulness, meditation, and relaxation exercises  Treatment Plan Client Abilities/Strengths  Juwana presents as motivated to improve her situation.  Client Treatment Preferences  None noted.  Client Statement of Needs  Criss is seeking CBT to manage symptoms of depression related to difficulty in social interactions.  Treatment Level  Biweekly  Symptoms  Autism: difficulty perceiving and responding to social cues, engaging in reciprocal conversation (Status: maintained). Depression: sleep irregularity, depressed mood, tearfulness, poor  self confidence, feelings of loneliness (Status: maintained).  Problems Addressed  New Description  Goals 1. Layonna experiences symptoms of depression related to difficulty connecting socially with others Objective Rigby will increase her sense of well-being and connectedness and decrease symptoms of depression Target Date: 05/18/2025 Frequency: Biweekly  Progress: 30 Modality: individual  Related Interventions Therapist will provide referrals for additional resources as appropriate Therapist will work with Chandni to develop her social skills incorporating role-play exercises and video modeling, such as through the [employer] role play videos Therapist will engage Sundy in behavioral activation, including the incorporation of structure, pleasant events, and mastery activities into her routine Therapist will provide Astra with strategies to regulate her emotions, including self-care, mindfulness, meditation, and relaxation exercises Therapist will help Sidney to identify and disengage from maladaptive thought patterns using CBT-based strategies Therapist will provide Larenda with opportunities to process her experiences in session Diagnosis Axis none 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

## 2024-06-01 ENCOUNTER — Ambulatory Visit: Admitting: Clinical

## 2024-06-01 DIAGNOSIS — F840 Autistic disorder: Secondary | ICD-10-CM

## 2024-06-01 DIAGNOSIS — F3489 Other specified persistent mood disorders: Secondary | ICD-10-CM | POA: Diagnosis not present

## 2024-06-01 DIAGNOSIS — F900 Attention-deficit hyperactivity disorder, predominantly inattentive type: Secondary | ICD-10-CM | POA: Diagnosis not present

## 2024-06-01 DIAGNOSIS — F331 Major depressive disorder, recurrent, moderate: Secondary | ICD-10-CM | POA: Diagnosis not present

## 2024-06-01 DIAGNOSIS — F419 Anxiety disorder, unspecified: Secondary | ICD-10-CM | POA: Diagnosis not present

## 2024-06-01 NOTE — Progress Notes (Signed)
 Diagnosis: F84.0, F33.1 Time: 11:01 am-11:58 am CPT: 90791P  Jocelyn was seen in person for therapy. She reported that her hours had been cut at work, and she had realized she enjoys the community and misses being there more often. However, she also reflected upon stressors related to factors at work. Therapist pointed out the evolution of Frona's feelings about her job, encouraging her to remain open to her options while looking for opportunities where she is. She is scheduled to be seen again in one month, and will reach out if she needs to be seen sooner.   Objectives Related Problem: Dariah experiences symptoms of depression related to difficulty connecting socially with others Description: Iya will increase her sense of well-being and connectedness and decrease symptoms of depression Target Date: 2025-05-18 Frequency: Biweekly Modality: individual Progress: 30% Planned Intervention: Therapist will provide referrals for additional resources as appropriate  Planned Intervention: Therapist will engage Yaileen in behavioral activation, including the incorporation of structure, pleasant events, and mastery activities into her routine  Planned Intervention: Therapist will help Pennye to identify and disengage from maladaptive thought and behavior patterns using CBT-based strategies  Planned Intervention: Therapist will provide Kimiyah with opportunities to process her experiences in session  Planned Intervention: Therapist will provide Tilly with strategies to regulate her emotions, including self-care, mindfulness, meditation, and relaxation exercises  Treatment Plan Client Abilities/Strengths  Henry presents as motivated to improve her situation.  Client Treatment Preferences  None noted.  Client Statement of Needs  Kenyada is seeking CBT to manage symptoms of depression related to difficulty in social interactions.  Treatment Level  Biweekly  Symptoms  Autism: difficulty perceiving and  responding to social cues, engaging in reciprocal conversation (Status: maintained). Depression: sleep irregularity, depressed mood, tearfulness, poor self confidence, feelings of loneliness (Status: maintained).  Problems Addressed  New Description  Goals 1. Shalyn experiences symptoms of depression related to difficulty connecting socially with others Objective Calen will increase her sense of well-being and connectedness and decrease symptoms of depression Target Date: 05/18/2025 Frequency: Biweekly  Progress: 30 Modality: individual  Related Interventions Therapist will provide referrals for additional resources as appropriate Therapist will work with Alee to develop her social skills incorporating role-play exercises and video modeling, such as through the [employer] role play videos Therapist will engage Racheal in behavioral activation, including the incorporation of structure, pleasant events, and mastery activities into her routine Therapist will provide Mariha with strategies to regulate her emotions, including self-care, mindfulness, meditation, and relaxation exercises Therapist will help Cassandria to identify and disengage from maladaptive thought patterns using CBT-based strategies Therapist will provide Reeta with opportunities to process her experiences in session Diagnosis Axis none 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

## 2024-06-29 ENCOUNTER — Ambulatory Visit: Admitting: Clinical

## 2024-06-29 DIAGNOSIS — F331 Major depressive disorder, recurrent, moderate: Secondary | ICD-10-CM

## 2024-06-29 DIAGNOSIS — F840 Autistic disorder: Secondary | ICD-10-CM

## 2024-06-29 NOTE — Progress Notes (Signed)
 Diagnosis: F84.0, F33.1 Time: 11:01 am-11:58 am CPT: 90837P  Felicia [name] was seen in person for therapy. She reported that she has been doing well overall, and enjoyed a quiet holiday at home with her family. Session focused work related stressors. Felicia [name] shared several examples of effective communication, which therapist pointed out. Therapist also engaged Felicia [name] in consideration of communication strategies to help her address continued stressors. She is scheduled to be seen again in two weeks.  Objectives Related Problem: Felicia [name] experiences symptoms of depression related to difficulty connecting socially with others Description: Felicia [name] will increase her sense of well-being and connectedness and decrease symptoms of depression Target Date: 2025-05-18 Frequency: Biweekly Modality: individual Progress: 30% Planned Intervention: Therapist will provide referrals for additional resources as appropriate  Planned Intervention: Therapist will engage Felicia [name] in behavioral activation, including Felicia incorporation of structure, pleasant events, and mastery activities into her routine  Planned Intervention: Therapist will help Felicia [name] to identify and disengage from maladaptive thought and behavior patterns using CBT-based strategies  Planned Intervention: Therapist will provide Felicia [name] with opportunities to process her experiences in session  Planned Intervention: Therapist will provide Felicia [name] with strategies to regulate her emotions, including self-care, mindfulness, meditation, and relaxation exercises  Treatment Plan Client Abilities/Strengths  Felicia [name] presents as motivated to improve her situation.  Client Treatment Preferences  None noted.  Client Statement of Needs  Felicia [name] is seeking CBT to manage symptoms of depression related to difficulty in social interactions.  Treatment Level  Biweekly  Symptoms  Autism: difficulty perceiving and responding to social cues, engaging in reciprocal conversation (Status:  maintained). Depression: sleep irregularity, depressed mood, tearfulness, poor self confidence, feelings of loneliness (Status: maintained).  Problems Addressed  New Description  Goals 1. Felicia [name] experiences symptoms of depression related to difficulty connecting socially with others Objective Felicia [name] will increase her sense of well-being and connectedness and decrease symptoms of depression Target Date: 05/18/2025 Frequency: Biweekly  Progress: 30 Modality: individual  Related Interventions Therapist will provide referrals for additional resources as appropriate Therapist will work with Felicia [name] to develop her social skills incorporating role-play exercises and video modeling, such as through Felicia Felicia [name] role play videos Therapist will engage Felicia [name] in behavioral activation, including Felicia incorporation of structure, pleasant events, and mastery activities into her routine Therapist will provide Felicia [name] with strategies to regulate her emotions, including self-care, mindfulness, meditation, and relaxation exercises Therapist will help Felicia [name] to identify and disengage from maladaptive thought patterns using CBT-based strategies Therapist will provide Felicia [name] with opportunities to process her experiences in session Diagnosis Axis none 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

## 2024-07-13 ENCOUNTER — Ambulatory Visit: Admitting: Clinical

## 2024-07-13 DIAGNOSIS — F840 Autistic disorder: Secondary | ICD-10-CM | POA: Diagnosis not present

## 2024-07-13 DIAGNOSIS — F331 Major depressive disorder, recurrent, moderate: Secondary | ICD-10-CM | POA: Diagnosis not present
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
## 2024-07-27 ENCOUNTER — Ambulatory Visit: Admitting: Clinical

## 2024-08-10 ENCOUNTER — Ambulatory Visit: Admitting: Clinical

## 2024-09-07 ENCOUNTER — Ambulatory Visit: Admitting: Clinical
